# Patient Record
Sex: Female | Born: 1945 | Hispanic: No | Smoking: Never smoker
Health system: Southern US, Community
[De-identification: ages and names within clinical notes are randomized; demographics above are authoritative.]

## PROBLEM LIST (undated history)

## (undated) DIAGNOSIS — N393 Stress incontinence (female) (male): Secondary | ICD-10-CM

## (undated) DIAGNOSIS — S82899A Other fracture of unspecified lower leg, initial encounter for closed fracture: Secondary | ICD-10-CM

## (undated) DIAGNOSIS — I10 Essential (primary) hypertension: Secondary | ICD-10-CM

## (undated) DIAGNOSIS — I6529 Occlusion and stenosis of unspecified carotid artery: Secondary | ICD-10-CM

## (undated) DIAGNOSIS — G35 Multiple sclerosis: Secondary | ICD-10-CM

## (undated) DIAGNOSIS — K589 Irritable bowel syndrome without diarrhea: Secondary | ICD-10-CM

## (undated) DIAGNOSIS — I340 Nonrheumatic mitral (valve) insufficiency: Secondary | ICD-10-CM

## (undated) DIAGNOSIS — E785 Hyperlipidemia, unspecified: Secondary | ICD-10-CM

## (undated) DIAGNOSIS — S92909A Unspecified fracture of unspecified foot, initial encounter for closed fracture: Secondary | ICD-10-CM

## (undated) DIAGNOSIS — E538 Deficiency of other specified B group vitamins: Secondary | ICD-10-CM

## (undated) DIAGNOSIS — R911 Solitary pulmonary nodule: Secondary | ICD-10-CM

## (undated) DIAGNOSIS — R159 Full incontinence of feces: Secondary | ICD-10-CM

## (undated) DIAGNOSIS — I5189 Other ill-defined heart diseases: Secondary | ICD-10-CM

## (undated) DIAGNOSIS — S42409A Unspecified fracture of lower end of unspecified humerus, initial encounter for closed fracture: Secondary | ICD-10-CM

## (undated) DIAGNOSIS — J3089 Other allergic rhinitis: Secondary | ICD-10-CM

## (undated) DIAGNOSIS — I447 Left bundle-branch block, unspecified: Secondary | ICD-10-CM

## (undated) HISTORY — DX: Full incontinence of feces: R15.9

## (undated) HISTORY — DX: Essential (primary) hypertension: I10

## (undated) HISTORY — DX: Other allergic rhinitis: J30.89

## (undated) HISTORY — DX: Hyperlipidemia, unspecified: E78.5

## (undated) HISTORY — DX: Stress incontinence (female) (male): N39.3

## (undated) HISTORY — DX: Nonrheumatic mitral (valve) insufficiency: I34.0

## (undated) HISTORY — PX: VAGINAL HYSTERECTOMY: SUR661

## (undated) HISTORY — DX: Deficiency of other specified B group vitamins: E53.8

## (undated) HISTORY — DX: Unspecified fracture of lower end of unspecified humerus, initial encounter for closed fracture: S42.409A

## (undated) HISTORY — DX: Solitary pulmonary nodule: R91.1

## (undated) HISTORY — DX: Multiple sclerosis: G35

## (undated) HISTORY — DX: Irritable bowel syndrome, unspecified: K58.9

## (undated) HISTORY — DX: Other ill-defined heart diseases: I51.89

## (undated) HISTORY — PX: SALPINGOOPHORECTOMY: SHX82

## (undated) HISTORY — DX: Unspecified fracture of unspecified foot, initial encounter for closed fracture: S92.909A

## (undated) HISTORY — DX: Other fracture of unspecified lower leg, initial encounter for closed fracture: S82.899A

## (undated) HISTORY — DX: Occlusion and stenosis of unspecified carotid artery: I65.29

## (undated) HISTORY — DX: Irritable bowel syndrome without diarrhea: K58.9

## (undated) HISTORY — DX: Left bundle-branch block, unspecified: I44.7

---

## 1998-11-21 ENCOUNTER — Other Ambulatory Visit: Admission: RE | Admit: 1998-11-21 | Discharge: 1998-11-21 | Payer: Self-pay | Admitting: Gynecology

## 1999-02-11 ENCOUNTER — Other Ambulatory Visit: Admission: RE | Admit: 1999-02-11 | Discharge: 1999-02-11 | Payer: Self-pay | Admitting: Gastroenterology

## 1999-02-11 ENCOUNTER — Encounter (INDEPENDENT_AMBULATORY_CARE_PROVIDER_SITE_OTHER): Payer: Self-pay

## 1999-05-23 ENCOUNTER — Encounter: Admission: RE | Admit: 1999-05-23 | Discharge: 1999-05-23 | Payer: Self-pay | Admitting: Gynecology

## 1999-05-23 ENCOUNTER — Encounter: Payer: Self-pay | Admitting: Gynecology

## 1999-12-13 ENCOUNTER — Other Ambulatory Visit: Admission: RE | Admit: 1999-12-13 | Discharge: 1999-12-13 | Payer: Self-pay | Admitting: Gynecology

## 2000-02-05 ENCOUNTER — Other Ambulatory Visit: Admission: RE | Admit: 2000-02-05 | Discharge: 2000-02-05 | Payer: Self-pay | Admitting: Gynecology

## 2000-02-05 ENCOUNTER — Encounter (INDEPENDENT_AMBULATORY_CARE_PROVIDER_SITE_OTHER): Payer: Self-pay | Admitting: Specialist

## 2000-03-24 ENCOUNTER — Inpatient Hospital Stay (HOSPITAL_COMMUNITY): Admission: RE | Admit: 2000-03-24 | Discharge: 2000-03-26 | Payer: Self-pay | Admitting: Gynecology

## 2000-03-24 ENCOUNTER — Encounter (INDEPENDENT_AMBULATORY_CARE_PROVIDER_SITE_OTHER): Payer: Self-pay

## 2000-05-25 ENCOUNTER — Encounter: Payer: Self-pay | Admitting: Gynecology

## 2000-05-25 ENCOUNTER — Encounter: Admission: RE | Admit: 2000-05-25 | Discharge: 2000-05-25 | Payer: Self-pay | Admitting: Gynecology

## 2000-07-21 HISTORY — PX: CHOLECYSTECTOMY: SHX55

## 2000-07-22 ENCOUNTER — Inpatient Hospital Stay (HOSPITAL_COMMUNITY): Admission: EM | Admit: 2000-07-22 | Discharge: 2000-07-24 | Payer: Self-pay | Admitting: Emergency Medicine

## 2000-07-22 ENCOUNTER — Encounter: Payer: Self-pay | Admitting: Emergency Medicine

## 2000-07-23 ENCOUNTER — Encounter: Payer: Self-pay | Admitting: Emergency Medicine

## 2000-07-23 ENCOUNTER — Encounter: Payer: Self-pay | Admitting: Cardiology

## 2000-07-29 ENCOUNTER — Encounter: Payer: Self-pay | Admitting: Family Medicine

## 2000-07-29 ENCOUNTER — Encounter: Admission: RE | Admit: 2000-07-29 | Discharge: 2000-07-29 | Payer: Self-pay | Admitting: Family Medicine

## 2000-08-12 ENCOUNTER — Encounter: Payer: Self-pay | Admitting: General Surgery

## 2000-08-12 ENCOUNTER — Observation Stay (HOSPITAL_COMMUNITY): Admission: RE | Admit: 2000-08-12 | Discharge: 2000-08-13 | Payer: Self-pay | Admitting: General Surgery

## 2000-08-12 ENCOUNTER — Encounter (INDEPENDENT_AMBULATORY_CARE_PROVIDER_SITE_OTHER): Payer: Self-pay

## 2000-09-21 ENCOUNTER — Ambulatory Visit (HOSPITAL_COMMUNITY): Admission: RE | Admit: 2000-09-21 | Discharge: 2000-09-21 | Payer: Self-pay | Admitting: Family Medicine

## 2000-09-21 ENCOUNTER — Encounter: Payer: Self-pay | Admitting: Family Medicine

## 2001-03-16 ENCOUNTER — Other Ambulatory Visit: Admission: RE | Admit: 2001-03-16 | Discharge: 2001-03-16 | Payer: Self-pay | Admitting: Gynecology

## 2001-03-24 ENCOUNTER — Ambulatory Visit (HOSPITAL_COMMUNITY): Admission: RE | Admit: 2001-03-24 | Discharge: 2001-03-24 | Payer: Self-pay | Admitting: Family Medicine

## 2001-03-24 ENCOUNTER — Encounter: Payer: Self-pay | Admitting: Family Medicine

## 2002-01-26 ENCOUNTER — Encounter: Admission: RE | Admit: 2002-01-26 | Discharge: 2002-01-26 | Payer: Self-pay | Admitting: Gynecology

## 2002-01-26 ENCOUNTER — Encounter: Payer: Self-pay | Admitting: Gynecology

## 2002-01-27 ENCOUNTER — Encounter: Admission: RE | Admit: 2002-01-27 | Discharge: 2002-01-27 | Payer: Self-pay | Admitting: Internal Medicine

## 2002-01-27 ENCOUNTER — Encounter: Payer: Self-pay | Admitting: Internal Medicine

## 2002-05-23 ENCOUNTER — Other Ambulatory Visit: Admission: RE | Admit: 2002-05-23 | Discharge: 2002-05-23 | Payer: Self-pay | Admitting: Gynecology

## 2002-07-21 HISTORY — PX: CARDIAC CATHETERIZATION: SHX172

## 2003-01-31 ENCOUNTER — Ambulatory Visit (HOSPITAL_COMMUNITY): Admission: RE | Admit: 2003-01-31 | Discharge: 2003-01-31 | Payer: Self-pay | Admitting: Orthopedic Surgery

## 2003-04-01 ENCOUNTER — Ambulatory Visit (HOSPITAL_COMMUNITY): Admission: RE | Admit: 2003-04-01 | Discharge: 2003-04-01 | Payer: Self-pay | Admitting: Internal Medicine

## 2003-04-01 ENCOUNTER — Encounter: Payer: Self-pay | Admitting: Internal Medicine

## 2003-05-17 ENCOUNTER — Encounter: Admission: RE | Admit: 2003-05-17 | Discharge: 2003-05-17 | Payer: Self-pay | Admitting: Internal Medicine

## 2003-05-26 ENCOUNTER — Ambulatory Visit (HOSPITAL_COMMUNITY): Admission: RE | Admit: 2003-05-26 | Discharge: 2003-05-26 | Payer: Self-pay | Admitting: Cardiology

## 2003-06-01 ENCOUNTER — Ambulatory Visit (HOSPITAL_COMMUNITY): Admission: RE | Admit: 2003-06-01 | Discharge: 2003-06-01 | Payer: Self-pay | Admitting: Cardiology

## 2003-09-20 ENCOUNTER — Encounter: Admission: RE | Admit: 2003-09-20 | Discharge: 2003-09-20 | Payer: Self-pay | Admitting: Internal Medicine

## 2003-10-04 ENCOUNTER — Ambulatory Visit (HOSPITAL_COMMUNITY): Admission: RE | Admit: 2003-10-04 | Discharge: 2003-10-04 | Payer: Self-pay | Admitting: Internal Medicine

## 2004-06-06 ENCOUNTER — Encounter: Admission: RE | Admit: 2004-06-06 | Discharge: 2004-06-06 | Payer: Self-pay | Admitting: Internal Medicine

## 2004-07-16 ENCOUNTER — Ambulatory Visit: Payer: Self-pay | Admitting: Gastroenterology

## 2004-07-23 ENCOUNTER — Ambulatory Visit: Payer: Self-pay | Admitting: Gastroenterology

## 2005-10-24 ENCOUNTER — Encounter: Admission: RE | Admit: 2005-10-24 | Discharge: 2005-10-24 | Payer: Self-pay | Admitting: Internal Medicine

## 2006-06-22 ENCOUNTER — Other Ambulatory Visit: Admission: RE | Admit: 2006-06-22 | Discharge: 2006-06-22 | Payer: Self-pay | Admitting: Gynecology

## 2006-07-21 DIAGNOSIS — I6529 Occlusion and stenosis of unspecified carotid artery: Secondary | ICD-10-CM

## 2006-07-21 HISTORY — DX: Occlusion and stenosis of unspecified carotid artery: I65.29

## 2006-11-02 ENCOUNTER — Encounter: Admission: RE | Admit: 2006-11-02 | Discharge: 2006-11-02 | Payer: Self-pay | Admitting: Internal Medicine

## 2007-04-26 ENCOUNTER — Ambulatory Visit: Payer: Self-pay | Admitting: Surgery

## 2007-11-04 ENCOUNTER — Encounter: Admission: RE | Admit: 2007-11-04 | Discharge: 2007-11-04 | Payer: Self-pay | Admitting: Internal Medicine

## 2008-11-06 ENCOUNTER — Encounter: Admission: RE | Admit: 2008-11-06 | Discharge: 2008-11-06 | Payer: Self-pay | Admitting: Internal Medicine

## 2009-11-26 ENCOUNTER — Encounter: Admission: RE | Admit: 2009-11-26 | Discharge: 2009-11-26 | Payer: Self-pay | Admitting: Internal Medicine

## 2010-12-03 NOTE — Procedures (Signed)
CAROTID DUPLEX EXAM   INDICATION:  Carotid artery bruit.   HISTORY:  Diabetes:  No.  Cardiac:  No.  Hypertension:  Yes.  Smoking:  No.  Previous Surgery:  No.  CV History:  No.  Amaurosis Fugax No, Paresthesias No, Hemiparesis No                                       RIGHT             LEFT  Brachial systolic pressure:         110               110  Brachial Doppler waveforms:         Triphasic         Triphasic  Vertebral direction of flow:        Antegrade         Antegrade  DUPLEX VELOCITIES (cm/sec)  CCA peak systolic                   85                93  ECA peak systolic                   67                75  ICA peak systolic                   61                61  ICA end diastolic                   19                25  PLAQUE MORPHOLOGY:                  Mixed             Mixed  PLAQUE AMOUNT:                      Mild              Mild  PLAQUE LOCATION:                    CCA, ICA          CCA, ICA   IMPRESSION:  A 20-39% bilateral internal carotid artery stenosis (low  end of range).   A preliminary copy was faxed to Dr. Jone Baseman office.   ___________________________________________  Janetta Hora. Darrick Penna, MD   DP/MEDQ  D:  04/26/2007  T:  04/26/2007  Job:  [id number]

## 2010-12-06 NOTE — Op Note (Signed)
[facility]  Patient:    Kristina Donovan, Kristina Donovan                  MRN: [id number] Adm. Date:  [id number] Attending:  Katrina Stack CC:         Abran Cantor. Clovis Riley, M.D., [facility], [facility]                           Operative Report  PREOPERATIVE DIAGNOSES 1. Abnormal uterine bleeding with complex hyperplasia, persistent and    unresponsive. 2. Transmural fibroid with luminal intrusion. 3. Uterine descensus with grade 3 prolapse. 4. Rectocele and enterocele.  POSTOPERATIVE DIAGNOSES 1. Abnormal uterine bleeding with complex hyperplasia, persistent and    unresponsive. 2. Transmural fibroid with luminal intrusion. 3. Uterine descensus with grade 3 prolapse. 4. Rectocele and enterocele.  PROCEDURE:  Vaginal hysterectomy, posterior and enterocele repairs.  SURGEON:  Gretta Cool, M.D.  ASSISTANT:  Raynald Kemp, M.D.  ANESTHESIA:  General endotracheal.  DESCRIPTION OF PROCEDURE:  Under excellent general anesthesia, with the patient prepped and draped in lithotomy position, a weighted speculum was placed in the vagina and the cervix grasped with single-tooth tenaculum. Cervix was then infiltrated with Xylocaine with epinephrine 1:200,000.  The cervical mucosa was then circumcised and the mucosa pushed off the lower uterine segment.  The cul-de-sac was then entered and the uterosacral ligaments clamped, cut, sutured and tied with 0 Vicryl.  The peritoneum was then entered and the Deaver placed beneath the bladder.  The cardinal ligaments were then clamped, cut, sutured and tied with 0 Vicryl.  The uterine vessels were then also clamped, cut, sutured and tied with 0 Vicryl.  At this point, the uterus remained large enough that it could not be easily delivered through the cul-de-sac.  It was then morcellated and sectioned so as to allow visualization of the ovarian pedicles.  The ovarian ligaments were then clamped, cut, sutured  and tied.  The upper pedicles were then doubly ligated with 0 Vicryl.  At this point, careful examination of the ovaries revealed the left ovary with previous tubal sterilization quite accessible.  The ovary was then removed by clamping across the infundibulopelvic pedicle.  On the right, the ovary was considered inaccessible and adherent enough high in the pelvis that it was not reasonable to consider vaginal removal, because of risk of injury to the pedicle that could not be managed from below.  Decision was made to leave the right ovary in situ.  At this point, the cul-de-sac was closed with a running suture of #0 Monocryl from anterior peritoneum to lateral pedicles, with high ligation of the cul-de-sac peritoneum.  The pursestring suture was then tied tight.  The cardinal and uterosacral complex was then plicated to the apex of the vaginal cuff at each angle with interrupted sutures of #0 Ethibond.  The full-thickness of endopelvic fascia was then incorporated to close the vaginal cuff as a subcuticular closure with 2-0 Vicryl.  At this point, the bleeding was well-controlled.  Attention was then turned to the posterior repair and enterocele repair.  The mucosa was incised at the introitus and the mucosa dissected from the perirectal fascia to the apex of the vaginal cuff.  The mucosa was then dissected by blunt and sharp dissection from the perirectal fascia to the apex of the vaginal cuff. The enterocele was then completely closed by reattaching the perirectal fascia to the vaginal cuff with interrupted  sutures of #0 Vicryl.  The perirectal mucosa was then plicated from the apex of the cuff to the introitus, so as to plicate the levator ani fascia.  The perineal body was then rebuilt with interrupted sutures of #2-0 Vicryl.  The mucosa was then trimmed and upper layers of perirectal fascia and mucosa closed as a subcuticular closure of 2-0 Vicryl.  At this point, the procedure was  terminated without complication and patient returned to the recovery room in excellent condition.  Estimated blood loss:  300 cc.  Replacement:  None.  Complications:  None. DD:  03/24/00 TD:  03/25/00 Job: [id number]

## 2010-12-06 NOTE — Discharge Summary (Signed)
[facility]  Patient:    Kristina Donovan, Kristina Donovan                     MRN: [id number] Adm. Date:  07/22/00 Disc. Date: 07/24/00 Attending:  Lewayne Bunting, M.D. Dictator:   Rozell Searing, P.A. CC:         Desma Maxim, M.D.   Referring Physician Discharge [facility]  PROCEDURES:  Adenosine Cardiolite July 23, 2000.  REASON FOR ADMISSION:  Kristina Donovan is a 65 year old female without prior history of heart disease who was referred from her primary care physician for evaluation of abnormal EKG and associated chest discomfort.  She was admitted for rule out of MI and further diagnostic evaluation.  LABORATORY DATA:  Serial cardiac enzymes negative.  Lipid profile: Cholesterol 174, triglyceride 89, HDL 56, LDL 100.  Metabolic profile normal. WBC 14.3, hemoglobin 12.7, hematocrit 39.2, platelet 297, neutrophils 84%. INR 0.8.  D-dimer 0.70.  Admission chest x-ray:  NAD.  Spiral CT of chest revealed no pulmonary embolus; small RLL nodules.  HOSPITAL COURSE:  Serial cardiac enzymes were negative for myocardial infarction.  Patient was referred for noninvasive cardiac workup.  Adenosine Cardiolite performed July 23, 2000 revealed no evidence of ischemia with question of septal scar (possibly due to LBBB); EF 54%.  D-dimer was elevated (0.70) leading to spiral CT which, however, revealed no pulmonary embolus.  However, small (3-4 mm) nodules were seen in the RLL with recommendation for follow-up CT scan in 2-3 months.  MEDICATION ADJUSTMENTS:  Resume previous home medication regimen.  Medications at discharge: 1. Hormone patch as previously directed. 2. Allegra as needed.  FOLLOW-UP:  Patient is instructed to follow-up with Dr. Andee Lineman in approximately 2-4 weeks.  She is to call our office to schedule an appointment.  DISCHARGE DIAGNOSES: 1. Noncardiac chest pain.    a. Negative cardiac enzymes.    b. Nonischemic adenosine Cardiolite; ejection  fraction 54% (question septal       scar).    c. Negative spiral CT for pulmonary embolus. 2. Left bundle-branch block. 3. Small right lower lobe nodules - follow-up CT scan in two to three months. DD:  07/24/00 TD:  07/24/00 Job: [id number]

## 2010-12-06 NOTE — H&P (Signed)
[facility]  Patient:    Kristina Donovan, Kristina Donovan                  MRN: [id number] Adm. Date:  [id number] Attending:  Katrina Stack CC:         Abran Cantor. Clovis Riley, M.D.                         History and Physical  CHIEF COMPLAINT 1. Abnormal uterine bleeding. 2. Pelvic support problems.  HISTORY OF PRESENT ILLNESS:  [age]-year-old white married gravida 3, para 2, Ab1, with history of extremely heavy menstrual flow x 7 days, with passage of clots, requiring pads and tampons and still coming through her clothing in some cycles.  She also has significant pelvic support problems with uterine descensus, the cervix descending through the introitus.  She also has significant rectocele and enterocele and is symptomatic with standing.  She also has a problem with urgency-type incontinence; minor stress does not require pads.  She also reports some difficulty controlling stool, particularly liquid and gas.  She has a history of difficult deliveries elsewhere, with pelvic floor laceration with one of her deliveries.  She is now admitted for definitive repair by vaginal hysterectomy and posterior and enterocele repairs.  We have discussed her incontinence at length.  She has virtually all urge incontinence with ______ leakage.  We have discussed that that is primarily an overactive bladder problem rather than a surgical repair. She is now admitted for repairs as above.  PAST MEDICAL HISTORY:  Usual childhood disease without sequelae.  MEDICAL ILLNESSES:  None of consequence.  PREVIOUS HOSPITALIZATIONS:  Surgery for deviated septum in 1980, tubal ligation in 1975, colonoscopy in 1998, sigmoidoscopy in 1999.  FAMILY HISTORY:  Mother died of melanoma and breast cancer.  Father is living with history of stroke, diabetes and kidney cancer at age 53.  One brother, age 37, has had heart surgery.  Remote family history of colon carcinoma.  PRESENT MEDICATIONS:   Premphase for hormone replacement.  SOCIAL HISTORY:  Patient is a Merchandiser, retail for [employer].  Husband is employed by [employer].  Their children are grown.  REVIEW OF SYSTEMS:  HEENT:  Denies symptoms.  CARDIORESPIRATORY:  Denies asthma, cough, bronchitis, shortness of breath.  GI/GU:  Denies frequency, urgency, dysuria, change in bowel habits, food intolerance.  She does have a remote history of Crohns disease in 37 with no evidence of active disease in 1999.  PHYSICAL EXAMINATION  GENERAL:  Well-developed, well-nourished white female, 5 feet 10 inches, weight 188 pounds.  VITAL SIGNS:  Blood pressure 146/74.  HEENT:  PERLA.  Fundi benign.  Oropharynx clear.  NECK:  Supple without mass or thyroid enlargement.  CHEST:  Clear P-to-A.  HEART:  Regular rate and rhythm, without murmur or cardiac enlargement.  BREASTS:  Soft, without mass, nodes or nipple discharge.  ABDOMEN:  Soft, scaphoid, without mass or organomegaly.  PELVIC:  External genitalia:  Normal female.  Vagina clean, rugose.  She has descent of the cervix, particularly the anterior lip, through the introitus. There is old obstetric laceration with elongation of the anterior lip.  The vagina is well-supported anteriorly with very little rotational descent of the vesicle neck noted.  No significant paravaginal defects.  She does have significant posterior weakness, with enterocele and rectocele, grade 2.  The uterus is upper-limits-normal-size.  Adnexa clear.  RECTAL:  Rectovaginal confirms.  Note:  She has diminished sphincter tone.  EXTREMITIES:  Negative.  NEUROLOGIC:  Physiologic.  IMPRESSIONS 1. Abnormal uterine bleeding with complex hyperplasia without atypia at    endometrial sampling. 2. Pelvic support problems with uterine descensus, grade 3, grade 2 enterocele    and rectocele. 3. Mixed incontinence, predominantly urge. 4. History of Crohns disease, inactive.  PLAN:  Vaginal hysterectomy,  posterior and enterocele repairs.  I have discussed with the patient, risks and benefits of the procedure, alternatives, the possibility of requiring subsequent procedure for incontinence. DD:  03/25/00 TD:  03/25/00 Job: [id number]

## 2010-12-06 NOTE — Op Note (Signed)
[facility]  Patient:    Kristina Donovan, Kristina Donovan                  MRN: [id number] Proc. Date: 08/12/00 Adm. Date:  [id number] Disc. Date: [id number] Attending:  Ronaldo Miyamoto CC:         Desma Maxim, M.D.  Lewayne Bunting, M.D.   Operative Report  PREOPERATIVE DIAGNOSIS:  Chronic cholecystitis with cholelithiasis.  POSTOPERATIVE DIAGNOSIS:  Chronic cholecystitis with cholelithiasis.  OPERATION:  Laparoscopic cholecystectomy with intraoperative cholangiogram.  SURGEON:  Angelia Mould. Derrell Lolling, M.D.  FIRST ASSISTANT:  Gita Kudo, M.D. and Anselm Pancoast. Zachery Dakins, M.D.  OPERATIVE INDICATIONS:  This is a 65 year old white female who was recently hospitalized for substernal chest pain, epigastric pain, and nausea.  She was thought to have cardiac etiology of her pain.  She underwent extensive noninvasive cardiac workup which was negative.  She also had spiral CT to rule out pulmonary embolus which was negative.  Gallbladder ultrasound was performed as an outpatient on January 9, and this shows contracted gallbladder with multiple gallstones.  Common bile duct was normal.  Liver function tests were normal.  She has become asymptomatic.   She is brought to the operating room electively for cholecystectomy.  INTRAOPERATIVE FINDINGS:  The gallbladder was severely chronically inflamed. It was contracted and markedly thick walled.  There were extensive adhesion of the omentum and transverse colon and duodenum to the gallbladder.  These adhesions were chronic, and I was able to take them down uneventfully with good visualization.  The liver appeared healthy.  The stomach otherwise appeared healthy.  Small intestine and large intestine were otherwise grossly normal.  There was no peritoneal fluid or peritoneal abnormality otherwise. Operative cholangiogram showed no filling defects for sure, normal intrahepatic and extrahepatic bowel ducts, good flow of  contrast into the duodenum.  There was vague shadow at the bifurcation of the right and left hepatic ducts with incomplete filling.  This looked more like extrinsic compression from a swollen liver and did not look like a gallstone.  There was also good dilatation of the intrahepatic bowel ducts.  Dr. Zachery Dakins and I and Dr. Lynnette Caffey concurred about this, and we felt the best thing for the patient would be to simply follow her clinically and liver function tests from time to time.  OPERATIVE TECHNIQUE:  Following the induction of general endotracheal anesthesia, the patients abdomen was prepped and draped in sterile fashion. Marcaine 0.5% with epinephrine was used as local infiltration anesthetic.  A transverse incision was made at the lower end of he umbilicus through a previous laparoscopy scar.  The fascia was incised in the midline, and the abdominal cavity entered under direct vision.  A 10 mm Hasson trocar was inserted and secured with pursestring suture of 0 Vicryl.  Pneumoperitoneum was created.  Video camera was inserted with visualization and findings as described above.  A 10 mm trocar was placed in the subxiphoid region, and two 5 mm trocars placed in the right mid abdomen.  With careful dissection with blunt instruments as well as scissor dissection, we were able to take the omental adhesions off of the liver and gallbladder and expose the fundus of the gallbladder.  We then elevated the gallbladder and carefully dissected on the right and on the left with scissors to dissect the intra-abdominal viscera off of the gallbladder.  This was a nice dissection and atraumatic.  The gallbladder was quite thick walled, quite contracted, chronically inflamed, and  difficult to control, but we were able to keep it elevated and see the anatomy well.  We were able to dissect out the infundibulum and the cystic duct and the cystic artery after some period of time.  We isolated the  cystic artery as it went onto the gallbladder, controlled it with metal clips and divided it.  We isolated the cystic ducts.  Because of the chronic inflammation, I chose to do a cholangiogram.  I chose a Cooke catheter into the cystic duct and performed a cholangiogram using the C-arm.  This showed intrahepatic and extrahepatic bowel ducts, no filling defects, no dilatation, and prompt flow of contrast into the duodenum.  There appeared to be a flow artifact at the junction of the right and left hepatic ducts, and I felt that this was most likely extrinsic compression, did not think that this was characteristic of a stone.  The cholangiogram catheter was removed.  The cystic duct was controlled with metal clips and divided.  The gallbladder was then dissected from its bed with electrocautery and sharp dissection, placed in the specimen bag, and removed. The gallbladder bed of the liver was cauterized with electrocautery.  The operative field was copiously irrigated with more than 2000 cc of saline.  At the completion of the case, there was no bleeding or bile leak whatsoever. The area of dissection was inspected.  The trocars were removed under direct vision, and there was no bleeding from the trocar sites.  Pneumoperitoneum was released.   The fascia at the umbilicus was closed with 0 Vicryl suture.  The skin incisions were closed with subcuticular sutures of 4-0 Vicryl and Steri-Strips.  Clean bandages were placed, and the patient was taken to the recovery room in stable condition.  Estimated blood loss was about 30 to 40 cc.  Complications were none.  Sponge, needle, and instrument counts were correct. DD:  08/12/00 TD:  08/12/00 Job: [id number]

## 2010-12-06 NOTE — Discharge Summary (Signed)
[facility]  Patient:    Kristina Donovan, Kristina Donovan                     MRN: [id number] Adm. Date:  03/24/00 Disc. Date: 03/26/00 Attending:  Gretta Cool, M.D. Dictator:   1080 CC:         Lupe Carney, M.D.   Discharge Summary  HISTORY OF PRESENT ILLNESS:  Kristina Donovan is a 65 year old white, married female, gravida 3, para 2, AB 1, who reported extremely heavy menstrual flow for seven days with passing of large clots which require pads and tampons and still coming through the clothing.  She has significant pelvic support problems with uterine descensus, cervix descending through introitus.  She has a significant rectocele and enterocele and is symptomatic with standing.  She also has a problem with urgency incontinence, mild stress, and does not require pads.  She also reports difficulty controlling stool, especially if liquid and gas.  She reports difficult deliveries elsewhere with pelvic lacerations with one of her deliveries.  She is now admitted for definitive therapy by vaginal hysterectomy, posterior and enterocele repairs.  The risks and benefits have been discussed with the patient and she is now admitted.  ADMISSION EXAM:  CHEST:  Clear to auscultation and percussion.  HEART:  Rate and rhythm are regular without murmur, gallop, or cardiac enlargement.  ABDOMEN:  Soft and scaphoid without masses or organomegaly.  PELVIC:  External genitalia are within normal limits for female.  Vagina clean and ruga shows descent of the cervix, particularly the anterior lip through the introitus.  There is an old obstetric laceration with elongation of the anterior lip.  The vagina is well-supported anteriorly with little rotational descent of the vesical neck.  There is no significant paravaginal defect.  She does have significant posterior weakness with enterocele and rectocele grade 2.  The uterus is upper normal limit size, and adnexa bilaterally  clear. Rectovaginal exam confirms.  There is diminished sphincter tone.  IMPRESSION: 1. Abnormal uterine bleeding with complex hyperplasia without atypia at    endometrial sampling. 2. Pelvic support problems with uterine descensus grade 3, enterocele and    rectocele grade 2.  PLAN:  Vaginal hysterectomy, posterior and enterocele repairs under general anesthesia.  The risks and benefits have been discussed with the patient.  She accepts these procedures.  LABORATORY DATA:  Admission hemoglobin 10.8, hematocrit 31.2.  On the first postoperative day, the hemoglobin was 9.2, hematocrit 26.7, and a white count of 7.7.  The EKG showed normal sinus rhythm, left axis deviation, left bundle branch block, abnormal EKG.  HOSPITAL COURSE:  The patient underwent hysterectomy, posterior and enterocele repairs under general anesthesia.  The procedure was completed without any complications and the patient was returned to the recovery room in excellent condition.  Estimated blood loss was 300 cc, replacement none.  Her postoperative course was without complications and she was discharged on the second postoperative day in excellent condition.  PATHOLOGY:  The pathology report revealed. 1. Proliferative endometrium with focal degenerative changes.  No residual    hyperplasia identified. 2. Adenomyosis. 3. Benign 1 cm uterine leiomyomata.  FINAL DISCHARGE INSTRUCTIONS:  Included no heavy lifting or straining, no vaginal entrance, and increased ambulation as tolerated.  She is to call for any fever of over 101.5 or failure of daily improvement.  Prior to discharge, she was passing gas and plan for Fleets enema and suppository if no results.  MEDICATIONS: 1. Vioxx 25 mg q.12-24h.  2. Percocet 1 p.o. q.4h. p.r.n. discomfort. 3. Estrogen patch.  She is to return to the office in one week for follow-up.  CONDITION ON DISCHARGE:  Excellent.  FINAL DISCHARGE DIAGNOSES: 1. Abnormal uterine  bleeding with complex hyperplasia, persistent and    unresponsive. 2. Transmural fibroid with luminal intrusion. 3. Uterine descensus with grade 3 prolapse. 4. Rectocele and enterocele.  PROCEDURES PERFORMED:  Vaginal hysterectomy, posterior and enterocele repairs under general anesthesia. DD:  04/20/00 TD:  04/20/00 Job: [id number]

## 2010-12-13 ENCOUNTER — Other Ambulatory Visit: Payer: Self-pay | Admitting: Internal Medicine

## 2010-12-13 DIAGNOSIS — Z1231 Encounter for screening mammogram for malignant neoplasm of breast: Secondary | ICD-10-CM

## 2011-01-10 ENCOUNTER — Ambulatory Visit
Admission: RE | Admit: 2011-01-10 | Discharge: 2011-01-10 | Disposition: A | Discharge status: Home / Self Care | Payer: 59 | Source: Ambulatory Visit | Attending: Internal Medicine | Admitting: Internal Medicine

## 2011-01-10 DIAGNOSIS — Z1231 Encounter for screening mammogram for malignant neoplasm of breast: Secondary | ICD-10-CM

## 2011-12-23 ENCOUNTER — Other Ambulatory Visit: Payer: Self-pay | Admitting: Internal Medicine

## 2011-12-23 DIAGNOSIS — Z1231 Encounter for screening mammogram for malignant neoplasm of breast: Secondary | ICD-10-CM

## 2012-01-09 ENCOUNTER — Ambulatory Visit
Admission: RE | Admit: 2012-01-09 | Discharge: 2012-01-09 | Disposition: A | Discharge status: Home / Self Care | Payer: 59 | Source: Ambulatory Visit | Attending: Internal Medicine | Admitting: Internal Medicine

## 2012-01-09 DIAGNOSIS — Z1231 Encounter for screening mammogram for malignant neoplasm of breast: Secondary | ICD-10-CM

## 2012-09-07 DIAGNOSIS — R05 Cough: Secondary | ICD-10-CM | POA: Diagnosis not present

## 2012-09-07 DIAGNOSIS — R509 Fever, unspecified: Secondary | ICD-10-CM | POA: Diagnosis not present

## 2012-09-07 DIAGNOSIS — R059 Cough, unspecified: Secondary | ICD-10-CM | POA: Diagnosis not present

## 2012-11-29 DIAGNOSIS — N390 Urinary tract infection, site not specified: Secondary | ICD-10-CM | POA: Diagnosis not present

## 2012-11-29 DIAGNOSIS — N3941 Urge incontinence: Secondary | ICD-10-CM | POA: Diagnosis not present

## 2012-11-29 DIAGNOSIS — R350 Frequency of micturition: Secondary | ICD-10-CM | POA: Diagnosis not present

## 2012-12-14 DIAGNOSIS — M79609 Pain in unspecified limb: Secondary | ICD-10-CM | POA: Diagnosis not present

## 2013-01-06 DIAGNOSIS — Z808 Family history of malignant neoplasm of other organs or systems: Secondary | ICD-10-CM | POA: Diagnosis not present

## 2013-01-06 DIAGNOSIS — I447 Left bundle-branch block, unspecified: Secondary | ICD-10-CM | POA: Diagnosis not present

## 2013-01-06 DIAGNOSIS — D1801 Hemangioma of skin and subcutaneous tissue: Secondary | ICD-10-CM | POA: Diagnosis not present

## 2013-01-06 DIAGNOSIS — I519 Heart disease, unspecified: Secondary | ICD-10-CM | POA: Diagnosis not present

## 2013-01-06 DIAGNOSIS — D239 Other benign neoplasm of skin, unspecified: Secondary | ICD-10-CM | POA: Diagnosis not present

## 2013-01-06 DIAGNOSIS — I789 Disease of capillaries, unspecified: Secondary | ICD-10-CM | POA: Diagnosis not present

## 2013-01-06 DIAGNOSIS — I119 Hypertensive heart disease without heart failure: Secondary | ICD-10-CM | POA: Diagnosis not present

## 2013-01-06 DIAGNOSIS — L819 Disorder of pigmentation, unspecified: Secondary | ICD-10-CM | POA: Diagnosis not present

## 2013-01-06 DIAGNOSIS — L538 Other specified erythematous conditions: Secondary | ICD-10-CM | POA: Diagnosis not present

## 2013-01-14 DIAGNOSIS — K589 Irritable bowel syndrome without diarrhea: Secondary | ICD-10-CM | POA: Diagnosis not present

## 2013-01-14 DIAGNOSIS — E538 Deficiency of other specified B group vitamins: Secondary | ICD-10-CM | POA: Diagnosis not present

## 2013-01-14 DIAGNOSIS — I10 Essential (primary) hypertension: Secondary | ICD-10-CM | POA: Diagnosis not present

## 2013-03-01 ENCOUNTER — Other Ambulatory Visit: Payer: Self-pay

## 2013-03-01 DIAGNOSIS — Z1231 Encounter for screening mammogram for malignant neoplasm of breast: Secondary | ICD-10-CM

## 2013-03-17 ENCOUNTER — Ambulatory Visit
Admission: RE | Admit: 2013-03-17 | Discharge: 2013-03-17 | Disposition: A | Discharge status: Home / Self Care | Payer: Medicare Other | Source: Ambulatory Visit

## 2013-03-17 DIAGNOSIS — Z1231 Encounter for screening mammogram for malignant neoplasm of breast: Secondary | ICD-10-CM

## 2013-03-23 ENCOUNTER — Other Ambulatory Visit: Payer: Self-pay | Admitting: Internal Medicine

## 2013-03-23 DIAGNOSIS — N6320 Unspecified lump in the left breast, unspecified quadrant: Secondary | ICD-10-CM

## 2013-03-23 DIAGNOSIS — R928 Other abnormal and inconclusive findings on diagnostic imaging of breast: Secondary | ICD-10-CM

## 2013-04-08 ENCOUNTER — Ambulatory Visit
Admission: RE | Admit: 2013-04-08 | Discharge: 2013-04-08 | Disposition: A | Discharge status: Home / Self Care | Payer: Medicare Other | Source: Ambulatory Visit | Attending: Internal Medicine | Admitting: Internal Medicine

## 2013-04-08 DIAGNOSIS — R928 Other abnormal and inconclusive findings on diagnostic imaging of breast: Secondary | ICD-10-CM | POA: Diagnosis not present

## 2013-05-12 DIAGNOSIS — G35 Multiple sclerosis: Secondary | ICD-10-CM | POA: Diagnosis not present

## 2013-05-12 DIAGNOSIS — H1045 Other chronic allergic conjunctivitis: Secondary | ICD-10-CM | POA: Diagnosis not present

## 2013-05-12 DIAGNOSIS — H04129 Dry eye syndrome of unspecified lacrimal gland: Secondary | ICD-10-CM | POA: Diagnosis not present

## 2013-05-12 DIAGNOSIS — H2510 Age-related nuclear cataract, unspecified eye: Secondary | ICD-10-CM | POA: Diagnosis not present

## 2013-07-22 DIAGNOSIS — M899 Disorder of bone, unspecified: Secondary | ICD-10-CM | POA: Diagnosis not present

## 2013-07-22 DIAGNOSIS — J309 Allergic rhinitis, unspecified: Secondary | ICD-10-CM | POA: Diagnosis not present

## 2013-07-22 DIAGNOSIS — E785 Hyperlipidemia, unspecified: Secondary | ICD-10-CM | POA: Diagnosis not present

## 2013-07-22 DIAGNOSIS — Z0000 Encounter for general adult medical examination without abnormal findings: Secondary | ICD-10-CM | POA: Diagnosis not present

## 2013-07-22 DIAGNOSIS — E538 Deficiency of other specified B group vitamins: Secondary | ICD-10-CM | POA: Diagnosis not present

## 2013-07-22 DIAGNOSIS — Z1331 Encounter for screening for depression: Secondary | ICD-10-CM | POA: Diagnosis not present

## 2013-07-22 DIAGNOSIS — I10 Essential (primary) hypertension: Secondary | ICD-10-CM | POA: Diagnosis not present

## 2013-07-22 DIAGNOSIS — M949 Disorder of cartilage, unspecified: Secondary | ICD-10-CM | POA: Diagnosis not present

## 2013-08-10 DIAGNOSIS — M899 Disorder of bone, unspecified: Secondary | ICD-10-CM | POA: Diagnosis not present

## 2013-12-08 DIAGNOSIS — N319 Neuromuscular dysfunction of bladder, unspecified: Secondary | ICD-10-CM | POA: Diagnosis not present

## 2013-12-08 DIAGNOSIS — N390 Urinary tract infection, site not specified: Secondary | ICD-10-CM | POA: Diagnosis not present

## 2013-12-08 DIAGNOSIS — N3941 Urge incontinence: Secondary | ICD-10-CM | POA: Diagnosis not present

## 2014-01-03 ENCOUNTER — Encounter: Payer: Self-pay | Admitting: General Surgery

## 2014-01-03 DIAGNOSIS — I10 Essential (primary) hypertension: Secondary | ICD-10-CM | POA: Insufficient documentation

## 2014-01-03 DIAGNOSIS — E785 Hyperlipidemia, unspecified: Secondary | ICD-10-CM | POA: Insufficient documentation

## 2014-01-11 ENCOUNTER — Ambulatory Visit (INDEPENDENT_AMBULATORY_CARE_PROVIDER_SITE_OTHER): Payer: Medicare Other | Admitting: Cardiology

## 2014-01-11 ENCOUNTER — Encounter: Payer: Self-pay | Admitting: Cardiology

## 2014-01-11 VITALS — BP 102/60 | HR 66 | Ht 67.5 in | Wt 175.0 lb

## 2014-01-11 DIAGNOSIS — Z808 Family history of malignant neoplasm of other organs or systems: Secondary | ICD-10-CM | POA: Diagnosis not present

## 2014-01-11 DIAGNOSIS — I10 Essential (primary) hypertension: Secondary | ICD-10-CM

## 2014-01-11 DIAGNOSIS — I447 Left bundle-branch block, unspecified: Secondary | ICD-10-CM | POA: Diagnosis not present

## 2014-01-11 DIAGNOSIS — D239 Other benign neoplasm of skin, unspecified: Secondary | ICD-10-CM | POA: Diagnosis not present

## 2014-01-11 DIAGNOSIS — L570 Actinic keratosis: Secondary | ICD-10-CM | POA: Diagnosis not present

## 2014-01-11 DIAGNOSIS — I5032 Chronic diastolic (congestive) heart failure: Secondary | ICD-10-CM | POA: Diagnosis not present

## 2014-01-11 DIAGNOSIS — D1801 Hemangioma of skin and subcutaneous tissue: Secondary | ICD-10-CM | POA: Diagnosis not present

## 2014-01-11 DIAGNOSIS — L819 Disorder of pigmentation, unspecified: Secondary | ICD-10-CM | POA: Diagnosis not present

## 2014-01-11 HISTORY — DX: Left bundle-branch block, unspecified: I44.7

## 2014-01-11 NOTE — Progress Notes (Signed)
[address] [place], [place]  [address] Phone: [phone] Fax:  [phone]  Date:  01/11/2014   ID:  Abbey, Veith 07/27/1945, MRN [id number]  PCP:  Irven Shelling, MD  Cardiologist:  Fransico Him, MD     History of Present Illness: Kristina Donovan is a 68 y.o. female with a history of HTN, chronic LBBB, dyslipidemia and chronic diastolic CHF who presents today for followup. She is doing well.  She denies any chest pain, SOB, DOE, LE edema, dizziness, palpitations or syncope.   Wt Readings from Last 3 Encounters:  01/11/14 175 lb (79.379 kg)     Past Medical History  Diagnosis Date  . Hypertension   . Hyperlipidemia   . Lung nodule     right lower lung  . Carotid artery stenosis 2008    mild bilateral  . Mild mitral regurgitation   . B12 deficiency     w chronic gastritis  . LBBB (left bundle branch block)   . Diastolic dysfunction   . Perennial allergic rhinitis   . IBS (irritable bowel syndrome)   . Fecal incontinence     Hx of this  . Urinary, incontinence, stress female   . Multiple sclerosis, relapsing-remitting   . Fracture, ankle     right  . Elbow fracture   . Fracture, foot     left    Current Outpatient Prescriptions  Medication Sig Dispense Refill  . calcium carbonate (OS-CAL) 600 MG TABS tablet Take 600 mg by mouth 2 (two) times daily with a meal.      . Cholecalciferol (VITAMIN D) 1000 UNITS capsule Take 1,000 Units by mouth daily.      Marland Kitchen lisinopril-hydrochlorothiazide (PRINZIDE,ZESTORETIC) 10-12.5 MG per tablet Take 1 tablet by mouth daily.      Marland Kitchen loperamide (IMODIUM A-D) 2 MG tablet Take 2 mg by mouth daily.      . pravastatin (PRAVACHOL) 40 MG tablet Take 40 mg by mouth daily.      . tamsulosin (FLOMAX) 0.4 MG CAPS capsule Take 0.4 mg by mouth daily.      . vitamin B-12 (CYANOCOBALAMIN) 1000 MCG tablet Take 1,000 mcg by mouth every other day. Three times a week       No current facility-administered medications  for this visit.    Allergies:    Allergies  Allergen Reactions  . Shellfish Allergy Anaphylaxis  . Fluzone [Flu Virus Vaccine]     Increased MS  . Nitrofuran Derivatives Diarrhea  . Sulfa Antibiotics Hives    Social History:  The patient  reports that she has never smoked. She does not have any smokeless tobacco history on file. She reports that she does not drink alcohol or use illicit drugs.   Family History:  The patient's family history includes Breast cancer in her mother; CAD in her brother and father; Heart disease in her father; Hyperlipidemia in her father; Hypertension in her father.   ROS:  Please see the history of present illness.      All other systems reviewed and negative.   PHYSICAL EXAM: VS:  BP 102/60  Pulse 66  Ht 5' 7.5" (1.715 m)  Wt 175 lb (79.379 kg)  BMI 26.99 kg/m2 Well nourished, well developed, in no acute distress HEENT: normal Neck: no JVD Cardiac:  normal S1, S2; RRR; no murmur Lungs:  clear to auscultation bilaterally, no wheezing, rhonchi or rales Abd: soft, nontender, no hepatomegaly Ext: no edema Skin: warm and dry  Neuro:  CNs 2-12 intact, no focal abnormalities noted      ASSESSMENT AND PLAN:  1. HTN well controlled - continue Lisinopril-HCT 2. Chronic diastolic CHF - continue Lisinopril-HCT 3. Chronic LBBB  Followup with me in 1 year  Signed, Fransico Him, MD 01/11/2014 11:38 AM

## 2014-01-11 NOTE — Patient Instructions (Signed)
Your physician recommends that you continue on your current medications as directed. Please refer to the Current Medication list given to you today.  Your physician wants you to follow-up in: 1 year with Dr. Turner. You will receive a reminder letter in the mail two months in advance. If you don't receive a letter, please call our office to schedule the follow-up appointment.  

## 2014-01-19 DIAGNOSIS — I10 Essential (primary) hypertension: Secondary | ICD-10-CM | POA: Diagnosis not present

## 2014-01-19 DIAGNOSIS — Z0000 Encounter for general adult medical examination without abnormal findings: Secondary | ICD-10-CM | POA: Diagnosis not present

## 2014-01-19 DIAGNOSIS — G35 Multiple sclerosis: Secondary | ICD-10-CM | POA: Diagnosis not present

## 2014-03-08 DIAGNOSIS — H532 Diplopia: Secondary | ICD-10-CM | POA: Diagnosis not present

## 2014-03-13 ENCOUNTER — Ambulatory Visit (INDEPENDENT_AMBULATORY_CARE_PROVIDER_SITE_OTHER): Payer: Medicare Other | Admitting: Neurology

## 2014-03-13 ENCOUNTER — Encounter: Payer: Self-pay | Admitting: Neurology

## 2014-03-13 VITALS — BP 140/60 | HR 68 | Temp 97.9°F | Resp 18 | Ht 69.0 in | Wt 173.8 lb

## 2014-03-13 DIAGNOSIS — G35 Multiple sclerosis: Secondary | ICD-10-CM | POA: Diagnosis not present

## 2014-03-13 DIAGNOSIS — G35D Multiple sclerosis, unspecified: Secondary | ICD-10-CM

## 2014-03-13 LAB — COMPLETE METABOLIC PANEL WITH GFR
ALBUMIN: 4.4 g/dL (ref 3.5–5.2)
ALK PHOS: 43 U/L (ref 39–117)
ALT: 22 U/L (ref 0–35)
AST: 19 U/L (ref 0–37)
BUN: 22 mg/dL (ref 6–23)
CALCIUM: 9.6 mg/dL (ref 8.4–10.5)
CHLORIDE: 100 meq/L (ref 96–112)
CO2: 33 mEq/L — ABNORMAL HIGH (ref 19–32)
Creat: 0.98 mg/dL (ref 0.50–1.10)
GFR, EST NON AFRICAN AMERICAN: 60 mL/min
GFR, Est African American: 69 mL/min
Glucose, Bld: 98 mg/dL (ref 70–99)
POTASSIUM: 3.8 meq/L (ref 3.5–5.3)
SODIUM: 139 meq/L (ref 135–145)
TOTAL PROTEIN: 6.6 g/dL (ref 6.0–8.3)
Total Bilirubin: 0.5 mg/dL (ref 0.2–1.2)

## 2014-03-13 LAB — CBC WITH DIFFERENTIAL/PLATELET
Basophils Absolute: 0 10*3/uL (ref 0.0–0.1)
Basophils Relative: 0 % (ref 0–1)
Eosinophils Absolute: 0.1 10*3/uL (ref 0.0–0.7)
Eosinophils Relative: 1 % (ref 0–5)
HEMATOCRIT: 34.8 % — AB (ref 36.0–46.0)
HEMOGLOBIN: 11.5 g/dL — AB (ref 12.0–15.0)
LYMPHS ABS: 3 10*3/uL (ref 0.7–4.0)
LYMPHS PCT: 49 % — AB (ref 12–46)
MCH: 29 pg (ref 26.0–34.0)
MCHC: 33 g/dL (ref 30.0–36.0)
MCV: 87.9 fL (ref 78.0–100.0)
Monocytes Absolute: 0.4 10*3/uL (ref 0.1–1.0)
Monocytes Relative: 6 % (ref 3–12)
NEUTROS PCT: 44 % (ref 43–77)
Neutro Abs: 2.7 10*3/uL (ref 1.7–7.7)
Platelets: 246 10*3/uL (ref 150–400)
RBC: 3.96 MIL/uL (ref 3.87–5.11)
RDW: 13.6 % (ref 11.5–15.5)
WBC: 6.2 10*3/uL (ref 4.0–10.5)

## 2014-03-13 NOTE — Patient Instructions (Addendum)
1.  We will set you up for Copaxone 40mg  three times a week (monday, wed, Fri).  Side effects may include injection-site reaction, chest tightness, shortness of breath, sweating, but it should only occur up to 30 minutes. 2.  We will get MRI of the brain and cervical spine with and without contrast [facility]  03/23/14 8am -10am  3.  We will check CBC with diff, CMP and vitamin D level 4.  If the vision problems recur, call and we can set you up for IV steroids 5.  Follow up in 6 months.

## 2014-03-13 NOTE — Progress Notes (Signed)
NEUROLOGY CONSULTATION NOTE  Kristina Donovan MRN: [id number] DOB: [birth year]  Referring provider: Dr. Laurann Montana Primary care provider: Dr. Laurann Montana  Reason for consult:  Vertical diplopia with history of MS  HISTORY OF PRESENT ILLNESS: Kristina Donovan is a 68 year old right-handed woman with history of relapsing-remitting MS, hypertension, hyperlipidemia, mild carotid artery disease, LBBB, mild MR, IBS, history of fecal incontinence, urinary incontinence, LBBB, and diastolic dysfunction who presents for MS. she is accompanied by her daughter.  She first exhibited symptoms in 1984. At that time she developed left leg numbness. In 1985, she developed right leg numbness. She then exhibited lower extremity numbness in 1986 as well. No definitive diagnosis was ever made at that time. She was doing well until 1997. At that time she developed leg numbness again. She reportedly had imaging of the brain, likely MRI with contrast. No lumbar puncture was performed. It was determined that she likely had multiple sclerosis but she was not started on a disease modifying agent because her symptoms had completely resolved.  In 2005, she had another flareup of left lower extremity numbness, this time associated with weakness. This followed a flu shot. She was not treated with IV steroids. At that time, she was evaluated by Dr. Erling Cruz at [facility].  She did have an MRI of the brain with and without contrast performed 10/16/03 for right sided numbness and weakness.  It revealed multiple, many ovoid-shaped, foci of hyperintensity in the bilateral periventricular white matter, including the callosal septal interface.  No abnormal enhancement.  MRA of the head was unremarkable.  She was started on Betaseron. She experienced flulike symptoms, but otherwise she tolerated it well. Since she did not have any further flareups, except for some mild symptoms of numbness, Dr. Erling Cruz discontinued her from the  Betaseron in 2013, since she was clinically and radiographically stable. This was discontinued and mainly because of the cost of the medication to the patient.  Last Monday, she developed sudden onset of vertical diplopia. It resolved the next day until midday, when it returned. She saw her primary care provider who started her on prednisone taper. Unfortunately, she did not take the prednisone taper exactly as prescribed. The diplopia persisted up until yesterday. She denied any blurred vision or eye pain. She denied any lower extremity symptoms. Possible trigger for this flareup could be stress related to her husband having been diagnosed with pancreatic cancer, as he was just recently started on therapy.  Medications include B12 1000 mcg three times weekly, vitamin D 1000 units, Flomax 0.4mg , pravastatin and Prinzide.  PAST MEDICAL HISTORY: Past Medical History  Diagnosis Date  . Hypertension   . Hyperlipidemia   . Lung nodule     right lower lung  . Carotid artery stenosis 2008    mild bilateral  . Mild mitral regurgitation   . B12 deficiency     w chronic gastritis  . LBBB (left bundle branch block)   . Diastolic dysfunction   . Perennial allergic rhinitis   . IBS (irritable bowel syndrome)   . Fecal incontinence     Hx of this  . Urinary, incontinence, stress female   . Multiple sclerosis, relapsing-remitting   . Fracture, ankle     right  . Elbow fracture   . Fracture, foot     left  . LBBB (left bundle branch block) 01/11/2014    PAST SURGICAL HISTORY: Past Surgical History  Procedure Laterality Date  . Cardiac catheterization  2004  Normal coronary arteries, chronic Exertional dyspnea, cardiolite 2008 normal  . Cholecystectomy  07/2000  . Vaginal hysterectomy      and enterocele repair  . Salpingoophorectomy      unilateral    MEDICATIONS: Current Outpatient Prescriptions on File Prior to Visit  Medication Sig Dispense Refill  . calcium carbonate (OS-CAL) 600  MG TABS tablet Take 600 mg by mouth 2 (two) times daily with a meal.      . Cholecalciferol (VITAMIN D) 1000 UNITS capsule Take 1,000 Units by mouth daily.      Marland Kitchen lisinopril-hydrochlorothiazide (PRINZIDE,ZESTORETIC) 10-12.5 MG per tablet Take 1 tablet by mouth daily.      Marland Kitchen loperamide (IMODIUM A-D) 2 MG tablet Take 2 mg by mouth daily.      . pravastatin (PRAVACHOL) 40 MG tablet Take 40 mg by mouth daily.      . tamsulosin (FLOMAX) 0.4 MG CAPS capsule Take 0.4 mg by mouth daily.      . vitamin B-12 (CYANOCOBALAMIN) 1000 MCG tablet Take 1,000 mcg by mouth every other day. Three times a week       No current facility-administered medications on file prior to visit.    ALLERGIES: Allergies  Allergen Reactions  . Shellfish Allergy Anaphylaxis  . Fluzone [Flu Virus Vaccine]     Increased MS  . Nitrofuran Derivatives Diarrhea  . Sulfa Antibiotics Hives    FAMILY HISTORY: Family History  Problem Relation Age of Onset  . Breast cancer Mother   . Hypertension Father   . Heart disease Father   . Hyperlipidemia Father   . CAD Father   . CAD Brother     SOCIAL HISTORY: History   Social History  . Marital Status: Married    Spouse Name: N/A    Number of Children: N/A  . Years of Education: N/A   Occupational History  . Not on file.   Social History Main Topics  . Smoking status: Never Smoker   . Smokeless tobacco: Not on file  . Alcohol Use: No  . Drug Use: No  . Sexual Activity: No   Other Topics Concern  . Not on file   Social History Narrative  . No narrative on file    REVIEW OF SYSTEMS: Constitutional: No fevers, chills, or sweats, no generalized fatigue, change in appetite Eyes: No visual changes, double vision, eye pain Ear, nose and throat: No hearing loss, ear pain, nasal congestion, sore throat Cardiovascular: No chest pain, palpitations Respiratory:  No shortness of breath at rest or with exertion, wheezes GastrointestinaI: No nausea, vomiting, diarrhea,  abdominal pain, fecal incontinence Genitourinary:  No dysuria, urinary retention or frequency Musculoskeletal:  No neck pain, back pain Integumentary: No rash, pruritus, skin lesions Neurological: as above Psychiatric: No depression, insomnia, anxiety Endocrine: No palpitations, fatigue, diaphoresis, mood swings, change in appetite, change in weight, increased thirst Hematologic/Lymphatic:  No anemia, purpura, petechiae. Allergic/Immunologic: no itchy/runny eyes, nasal congestion, recent allergic reactions, rashes  PHYSICAL EXAM: Filed Vitals:   03/13/14 0931  BP: 140/60  Pulse: 68  Temp: 97.9 F (36.6 C)  Resp: 18   General: No acute distress Head:  Normocephalic/atraumatic Neck: supple, no paraspinal tenderness, full range of motion Back: No paraspinal tenderness Heart: regular rate and rhythm Lungs: Clear to auscultation bilaterally. Vascular: No carotid bruits. Neurological Exam: Mental status: alert and oriented to person, place, and time, recent and remote memory intact, fund of knowledge intact, attention and concentration intact, speech fluent and not dysarthric, language intact. Cranial  nerves: CN I: not tested CN II: pupils equal, round and reactive to light, visual fields intact, fundi unremarkable, without vessel changes, exudates, hemorrhages or papilledema. CN III, IV, VI:  Left ptosis.  Full range of motion, no nystagmus. CN V: facial sensation intact CN VII: upper and lower face symmetric CN VIII: hearing intact CN IX, X: gag intact, uvula midline CN XI: sternocleidomastoid and trapezius muscles intact CN XII: tongue midline Bulk & Tone: normal, no fasciculations. Motor: 5 out of 5 for Sensation: Pinprick and vibration intact Deep Tendon Reflexes: 2+ throughout, although slightly more brisk in the left patellar. Toes downgoing. Finger to nose testing: No tremor or dysmetria. Heel to shin: No dysmetria. Gait: Ambulates with a mild left limp, which is  reportedly residual. Able to walk on toes and heels. Able to walk in tandem, although cautiously. Romberg with minimal sway.  IMPRESSION: Relapsing remitting multiple sclerosis.  PLAN: 1. After discussing the various disease modifying agents, she has decided on Copaxone 40 mg 3 times a week. Side effects discussed. 2. We'll check a vitamin D level. She is currently taking 1000 units daily. If level is less than 50, will have her increase the dose. 3. We'll check a CBC with differential and CMP. 4. If the diplopia or recurs, she will call us and we can set her up for IV Solu-Medrol, 1 g for 3 days. 5. We'll check MRI of the brain and cervical spine with and without contrast. 6. Followup in 6 months or as needed.  Thank you for allowing me to take part in the care of this patient.  Metta Clines, DO  CC:  Lavone Orn, MD

## 2014-03-14 LAB — VITAMIN D 25 HYDROXY (VIT D DEFICIENCY, FRACTURES): Vit D, 25-Hydroxy: 50 ng/mL (ref 30–89)

## 2014-03-15 ENCOUNTER — Telehealth: Payer: Self-pay | Admitting: *Deleted

## 2014-03-15 NOTE — Telephone Encounter (Signed)
Message copied by Claudie Revering on Wed Mar 15, 2014 11:21 AM ------      Message from: JAFFE, ADAM R      Created: Tue Mar 14, 2014  6:40 AM       Vit D level is 50, which is good.  But I would like it above 50.  Recommend increasing the D3 to 2000 IU daily.      ----- Message -----         From: Lab in [facility] Interface         Sent: 03/14/2014   4:13 AM           To: Dudley Major, DO                   ------

## 2014-03-15 NOTE — Telephone Encounter (Signed)
Patient is aware of vit D level and will increase to 2000 IU

## 2014-03-23 ENCOUNTER — Telehealth: Payer: Self-pay | Admitting: Neurology

## 2014-03-23 ENCOUNTER — Ambulatory Visit (HOSPITAL_COMMUNITY)
Admission: RE | Admit: 2014-03-23 | Discharge: 2014-03-23 | Disposition: A | Discharge status: Home / Self Care | Payer: Medicare Other | Source: Ambulatory Visit | Attending: Neurology | Admitting: Neurology

## 2014-03-23 ENCOUNTER — Ambulatory Visit (HOSPITAL_COMMUNITY): Admission: RE | Admit: 2014-03-23 | Payer: Medicare Other | Source: Ambulatory Visit

## 2014-03-23 DIAGNOSIS — R269 Unspecified abnormalities of gait and mobility: Secondary | ICD-10-CM | POA: Diagnosis not present

## 2014-03-23 DIAGNOSIS — M47812 Spondylosis without myelopathy or radiculopathy, cervical region: Secondary | ICD-10-CM | POA: Diagnosis not present

## 2014-03-23 DIAGNOSIS — G35 Multiple sclerosis: Secondary | ICD-10-CM | POA: Diagnosis not present

## 2014-03-23 DIAGNOSIS — H532 Diplopia: Secondary | ICD-10-CM | POA: Diagnosis not present

## 2014-03-23 DIAGNOSIS — M5030 Other cervical disc degeneration, unspecified cervical region: Secondary | ICD-10-CM | POA: Insufficient documentation

## 2014-03-23 MED ORDER — GADOBENATE DIMEGLUMINE 529 MG/ML IV SOLN
16.0000 mL | Freq: Once | INTRAVENOUS | Status: AC | PRN
  Administered 2014-03-23: 16 mL via INTRAVENOUS

## 2014-03-23 NOTE — Telephone Encounter (Signed)
Message copied by Annamaria Helling on Thu Mar 23, 2014 12:59 PM ------      Message from: JAFFE, ADAM R      Created: Thu Mar 23, 2014 12:32 PM       MRI shows no new or active MS.  It appears stable.      ----- Message -----         From: Rad Results In Interface         Sent: 03/23/2014  10:08 AM           To: Dudley Major, DO                   ------

## 2014-03-23 NOTE — Telephone Encounter (Signed)
Patient made aware of stable MR. She is to receive her medication on Friday and the nurse is do be out Monday to show her how to administer injection. She will call with any questions.

## 2014-03-30 ENCOUNTER — Ambulatory Visit: Payer: Medicare Other | Admitting: Neurology

## 2014-04-14 ENCOUNTER — Ambulatory Visit
Admission: RE | Admit: 2014-04-14 | Discharge: 2014-04-14 | Disposition: A | Discharge status: Home / Self Care | Payer: Medicare Other | Source: Ambulatory Visit

## 2014-04-14 ENCOUNTER — Other Ambulatory Visit: Payer: Self-pay

## 2014-04-14 DIAGNOSIS — Z1231 Encounter for screening mammogram for malignant neoplasm of breast: Secondary | ICD-10-CM | POA: Diagnosis not present

## 2014-05-26 DIAGNOSIS — M79601 Pain in right arm: Secondary | ICD-10-CM | POA: Diagnosis not present

## 2014-06-03 DIAGNOSIS — S9031XA Contusion of right foot, initial encounter: Secondary | ICD-10-CM | POA: Diagnosis not present

## 2014-06-05 ENCOUNTER — Ambulatory Visit (INDEPENDENT_AMBULATORY_CARE_PROVIDER_SITE_OTHER): Payer: Medicare Other | Admitting: Neurology

## 2014-06-05 ENCOUNTER — Encounter: Payer: Self-pay | Admitting: Neurology

## 2014-06-05 VITALS — BP 130/78 | HR 68 | Temp 98.2°F | Resp 18 | Wt 174.3 lb

## 2014-06-05 DIAGNOSIS — W19XXXA Unspecified fall, initial encounter: Secondary | ICD-10-CM | POA: Diagnosis not present

## 2014-06-05 DIAGNOSIS — R55 Syncope and collapse: Secondary | ICD-10-CM | POA: Diagnosis not present

## 2014-06-05 DIAGNOSIS — G35 Multiple sclerosis: Secondary | ICD-10-CM

## 2014-06-05 DIAGNOSIS — M79601 Pain in right arm: Secondary | ICD-10-CM

## 2014-06-05 NOTE — Patient Instructions (Addendum)
1.  I'm not sure of the cause for possibly passing out.  It doesn't seem like seizure but we will check EEG anyway.  I recommend seeing your heart doctor for any further workup from his end. 2.  Since you hit your head, we will get CT of the head [facility] 06/07/14 9:45 3.  I will refer you to Dr. Hulan Saas of [facility] for the arm pain. 4  Follow up in February.

## 2014-06-05 NOTE — Progress Notes (Signed)
NEUROLOGY FOLLOW UP OFFICE NOTE  Kristina Donovan [id number]  HISTORY OF PRESENT ILLNESS: Kristina Donovan is a 68 year old right-handed woman with history of relapsing-remitting MS, hypertension, hyperlipidemia, mild carotid artery disease, LBBB, mild MR, IBS, history of fecal incontinence, urinary incontinence, LBBB, and diastolic dysfunction who follows up for relapsing-remitting MS.  UPDATE: She was started on Copaxone.    MRI of the brain with and without contrast from 03/23/14 was stable compared to prior study from 2005.  MRI of the cervical spine with and without contrast showed non-enhancing plaques at C2 and C3 levels.  On 05/24/14, she woke up and had pain from the right shoulder and scapula down to the elbow.  It was a deep diffuse aching pain.  It was not a shooting radicular pain.  Over the next day or two, it spread down to the mid-forearm.  There was no associated numbness or weakness.  She saw a doctor who gave her tizanidine and advised her to use heating packs, which did not help.  Aleve did not help.  She took one of her husband's hydromorphone.  She cannot recall having strained her shoulder or arm.  A few days before, she was lifting a large heavy box of bottled water, but she felt fine.  On 06/02/14, she walked out of a store and she stood at the curb looking to step off and walk to her car.  The next thing she remembers was hitting the left side of her forehead and then her face.  She doesn't actually remember falling.  She denied any preceding lightheadedness or palpitations.  It was only a moment.  There was no reported seizure-like activity.  She did not bite her tongue or have incontinence.  She was not postictal.  She declined nearby police officers' recommendation to go to the ED.  She did go to the urgent care the following day for foot pain and was told to see her neurologist for possibly passing out.  She denied being dehydrated.  She denies history of  seizure.  03/13/14 LABS WBC 6.2, HGB 11.5, HCT 34.8, PLT 246, Na 139, K 3.8, Cl 100, CO2 33, glucose 98, BUN 22, CDr 0.98, ALP 43, AST 19, ALT 22, vitamin D 25-hydroxy 50.  HISTORY: She first exhibited symptoms in 1984. At that time she developed left leg numbness. In 1985, she developed right leg numbness. She then exhibited lower extremity numbness in 1986 as well. No definitive diagnosis was ever made at that time. She was doing well until 1997. At that time she developed leg numbness again. She reportedly had imaging of the brain, likely MRI with contrast. No lumbar puncture was performed. It was determined that she likely had multiple sclerosis but she was not started on a disease modifying agent because her symptoms had completely resolved.  In 2005, she had another flareup of left lower extremity numbness, this time associated with weakness. This followed a flu shot. She was not treated with IV steroids.  She had an MRI of the brain with and without contrast performed 10/16/03 for right sided numbness and weakness.  It revealed multiple, many ovoid-shaped, foci of hyperintensity in the bilateral periventricular white matter, including the callosal septal interface.  No abnormal enhancement.  MRA of the head was unremarkable.  She was started on Betaseron. She experienced flulike symptoms, but otherwise she tolerated it well. Since she did not have any further flareups, except for some mild symptoms of numbness, Betaseron was discontinued in  2013, since she was clinically and radiographically stable.  In August 2015, she developed sudden onset of vertical diplopia. It resolved the next day until midday, when it returned. She saw her primary care provider who started her on prednisone taper. Unfortunately, she did not take the prednisone taper exactly as prescribed. The diplopia persisted up until yesterday. She denied any blurred vision or eye pain. She denied any lower extremity symptoms. Possible trigger  for this flareup could be stress related to her husband having been diagnosed with pancreatic cancer, as he was just recently started on therapy.  Medications include Copaxone, B12 1000 mcg three times weekly, vitamin D 1000 units, Flomax 0.4mg , pravastatin and Prinzide.  PAST MEDICAL HISTORY: Past Medical History  Diagnosis Date  . Hypertension   . Hyperlipidemia   . Lung nodule     right lower lung  . Carotid artery stenosis 2008    mild bilateral  . Mild mitral regurgitation   . B12 deficiency     w chronic gastritis  . LBBB (left bundle branch block)   . Diastolic dysfunction   . Perennial allergic rhinitis   . IBS (irritable bowel syndrome)   . Fecal incontinence     Hx of this  . Urinary, incontinence, stress female   . Multiple sclerosis, relapsing-remitting   . Fracture, ankle     right  . Elbow fracture   . Fracture, foot     left  . LBBB (left bundle branch block) 01/11/2014    MEDICATIONS: Current Outpatient Prescriptions on File Prior to Visit  Medication Sig Dispense Refill  . calcium carbonate (OS-CAL) 600 MG TABS tablet Take 600 mg by mouth 2 (two) times daily with a meal.    . Cholecalciferol (VITAMIN D) 1000 UNITS capsule Take 1,000 Units by mouth daily.    Marland Kitchen lisinopril-hydrochlorothiazide (PRINZIDE,ZESTORETIC) 10-12.5 MG per tablet Take 1 tablet by mouth daily.    Marland Kitchen loperamide (IMODIUM A-D) 2 MG tablet Take 2 mg by mouth daily.    . pravastatin (PRAVACHOL) 40 MG tablet Take 40 mg by mouth daily.    . tamsulosin (FLOMAX) 0.4 MG CAPS capsule Take 0.4 mg by mouth daily.    . vitamin B-12 (CYANOCOBALAMIN) 1000 MCG tablet Take 1,000 mcg by mouth every other day. Three times a week     No current facility-administered medications on file prior to visit.    ALLERGIES: Allergies  Allergen Reactions  . Shellfish Allergy Anaphylaxis  . Fluzone [Flu Virus Vaccine]     Increased MS  . Nitrofuran Derivatives Diarrhea  . Sulfa Antibiotics Hives    FAMILY  HISTORY: Family History  Problem Relation Age of Onset  . Breast cancer Mother   . Hypertension Father   . Heart disease Father   . Hyperlipidemia Father   . CAD Father   . CAD Brother     SOCIAL HISTORY: History   Social History  . Marital Status: Married    Spouse Name: N/A    Number of Children: N/A  . Years of Education: N/A   Occupational History  . Not on file.   Social History Main Topics  . Smoking status: Never Smoker   . Smokeless tobacco: Not on file  . Alcohol Use: No  . Drug Use: No  . Sexual Activity: No   Other Topics Concern  . Not on file   Social History Narrative  . No narrative on file    REVIEW OF SYSTEMS: Constitutional: No fevers, chills, or sweats,  no generalized fatigue, change in appetite Eyes: No visual changes, double vision, eye pain Ear, nose and throat: No hearing loss, ear pain, nasal congestion, sore throat Cardiovascular: No chest pain, palpitations Respiratory:  No shortness of breath at rest or with exertion, wheezes GastrointestinaI: No nausea, vomiting, diarrhea, abdominal pain, fecal incontinence Genitourinary:  No dysuria, urinary retention or frequency Musculoskeletal:  No neck pain, back pain Integumentary: No rash, pruritus, skin lesions Neurological: as above Psychiatric: No depression, insomnia, anxiety Endocrine: No palpitations, fatigue, diaphoresis, mood swings, change in appetite, change in weight, increased thirst Hematologic/Lymphatic:  No anemia, purpura, petechiae. Allergic/Immunologic: no itchy/runny eyes, nasal congestion, recent allergic reactions, rashes  PHYSICAL EXAM: Filed Vitals:   06/05/14 1405  BP: 130/78  Pulse: 68  Temp: 98.2 F (36.8 C)  Resp: 18   General: No acute distress Head:  Normocephalic/atraumatic Eyes:  Fundoscopic exam unremarkable without vessel changes, exudates, hemorrhages or papilledema. Neck: supple, no paraspinal tenderness, full range of motion Heart:  Regular rate  and rhythm Lungs:  Clear to auscultation bilaterally Back: No paraspinal tenderness Neurological Exam: alert and oriented to person, place, and time. Attention span and concentration intact, recent and remote memory intact, fund of knowledge intact.  Speech fluent and not dysarthric, language intact.  CN II-XII intact. Fundoscopic exam unremarkable without vessel changes, exudates, hemorrhages or papilledema.  Bulk and tone normal, 5-/5 in right deltoid and 4+/5 right triceps, which may be limited due to pain.  Otherwise, muscle strength 5/5 throughout.  Sensation to light touch intact.  Deep tendon reflexes 2+ throughout.  Finger to nose with bilateral intention tremor.  Gait with limp.  IMPRESSION: 1.  Syncope versus concussion.  History of event does not really sound like seizure. 2.  Right shoulder and upper arm pain.  It sounds more myofascial than radicular or shoulder joint related. 3.  Relapsing-remitting MS, stable  PLAN: 1.  CT of head given recent fall and head traum 2.  EEG to evaluate syncope. 3.  Referral to sport's medicine to evaluate shoulder and arm. 4.  Also recommend seeing cardiologist, given that the episode sounds like syncope and she has history of LBBB 5.  For MS, she is on Copaxone and takes Vit D 1000 units daily 6.  She has follow up in February  Metta Clines, DO  CC:  Lavone Orn, MD

## 2014-06-06 ENCOUNTER — Encounter (INDEPENDENT_AMBULATORY_CARE_PROVIDER_SITE_OTHER): Payer: Medicare Other

## 2014-06-06 ENCOUNTER — Telehealth: Payer: Self-pay | Admitting: Cardiology

## 2014-06-06 ENCOUNTER — Ambulatory Visit (INDEPENDENT_AMBULATORY_CARE_PROVIDER_SITE_OTHER): Payer: Medicare Other | Admitting: Neurology

## 2014-06-06 ENCOUNTER — Encounter: Payer: Self-pay | Admitting: Radiology

## 2014-06-06 ENCOUNTER — Ambulatory Visit (INDEPENDENT_AMBULATORY_CARE_PROVIDER_SITE_OTHER): Payer: Medicare Other | Admitting: Cardiology

## 2014-06-06 ENCOUNTER — Encounter: Payer: Self-pay | Admitting: Cardiology

## 2014-06-06 VITALS — HR 78 | Ht 69.0 in | Wt 173.6 lb

## 2014-06-06 DIAGNOSIS — K5090 Crohn's disease, unspecified, without complications: Secondary | ICD-10-CM | POA: Insufficient documentation

## 2014-06-06 DIAGNOSIS — R55 Syncope and collapse: Secondary | ICD-10-CM | POA: Insufficient documentation

## 2014-06-06 DIAGNOSIS — I447 Left bundle-branch block, unspecified: Secondary | ICD-10-CM | POA: Diagnosis not present

## 2014-06-06 DIAGNOSIS — G35 Multiple sclerosis: Secondary | ICD-10-CM | POA: Insufficient documentation

## 2014-06-06 DIAGNOSIS — I10 Essential (primary) hypertension: Secondary | ICD-10-CM | POA: Diagnosis not present

## 2014-06-06 DIAGNOSIS — W19XXXA Unspecified fall, initial encounter: Secondary | ICD-10-CM

## 2014-06-06 DIAGNOSIS — E785 Hyperlipidemia, unspecified: Secondary | ICD-10-CM | POA: Diagnosis not present

## 2014-06-06 DIAGNOSIS — Z8249 Family history of ischemic heart disease and other diseases of the circulatory system: Secondary | ICD-10-CM

## 2014-06-06 DIAGNOSIS — I739 Peripheral vascular disease, unspecified: Secondary | ICD-10-CM

## 2014-06-06 NOTE — Assessment & Plan Note (Signed)
Pt referred today from Neurology after a syncopal episode 11/13.

## 2014-06-06 NOTE — Assessment & Plan Note (Signed)
She has had this since the 80's, possibly activated recently secondary to stress (her husband has stage 4 pancreatic cancer).

## 2014-06-06 NOTE — Assessment & Plan Note (Signed)
B/P controlled, she is not orthostatic.

## 2014-06-06 NOTE — Assessment & Plan Note (Signed)
Chronic. 

## 2014-06-06 NOTE — Progress Notes (Signed)
06/06/2014 Kristina Donovan   08/30/1945  [id number]  Primary Physician Irven Shelling, MD Primary Cardiologist: Dr Radford Pax  HPI:  Pleasant 68 y/o female followed by Dr Radford Pax with a history of HTN, dyslipidemia, FM Hx of CAD, mild carotid PVD by remote doppler, Multiple Sclerosis, and chronic LBBB. She reportedly had a cath in 2002 that was OK- those records are not available to me. She has not had any cardiac issues since, she denies any exertional chest pain or dyspnea. She has been under a great deal of stress recently, her husband has recently been diagnosed with stage 4 pancreatic cancer. On Nov 13th she had been shopping. She was standing at the curb, looked Lt then Rt, and the next thing she knew her head was on the pavement.  Her glasses had come off, she received minor facial abrasion. She declined transport to the hospital but did see an Urgent Care MD the next day. She denies any pre syncopal symptoms- no palpitations, no nausea, no visual disturbances. She has not had syncope in the past and has not had orthostatic symptoms.    Current Outpatient Prescriptions  Medication Sig Dispense Refill  . calcium carbonate (OS-CAL) 600 MG TABS tablet Take 600 mg by mouth 2 (two) times daily with a meal.    . Cholecalciferol (VITAMIN D) 1000 UNITS capsule Take 1,000 Units by mouth daily.    Marland Kitchen Glatiramer Acetate (COPAXONE) 40 MG/ML SOSY Inject into the skin once a week. 3 shots per week for Pt's MS Condition.    Marland Kitchen lisinopril-hydrochlorothiazide (PRINZIDE,ZESTORETIC) 10-12.5 MG per tablet Take 1 tablet by mouth daily.    . pravastatin (PRAVACHOL) 40 MG tablet Take 40 mg by mouth daily.    . tamsulosin (FLOMAX) 0.4 MG CAPS capsule Take 0.4 mg by mouth daily.    . vitamin B-12 (CYANOCOBALAMIN) 1000 MCG tablet Take 1,000 mcg by mouth every other day. Three times a week    . loperamide (IMODIUM A-D) 2 MG tablet Take 2 mg by mouth daily.     No current facility-administered medications for  this visit.    Allergies  Allergen Reactions  . Shellfish Allergy Anaphylaxis  . Fluzone [Flu Virus Vaccine]     Increased MS  . Nitrofuran Derivatives Diarrhea  . Sulfa Antibiotics Hives    History   Social History  . Marital Status: Married    Spouse Name: N/A    Number of Children: N/A  . Years of Education: N/A   Occupational History  . Not on file.   Social History Main Topics  . Smoking status: Never Smoker   . Smokeless tobacco: Not on file  . Alcohol Use: No  . Drug Use: No  . Sexual Activity: No   Other Topics Concern  . Not on file   Social History Narrative     Review of Systems: General: negative for chills, fever, night sweats or weight changes.  Cardiovascular: negative for chest pain, dyspnea on exertion, edema, orthopnea, palpitations, paroxysmal nocturnal dyspnea or shortness of breath Dermatological: negative for rash Respiratory: negative for cough or wheezing Urologic: negative for hematuria Abdominal: negative for nausea, vomiting, diarrhea, bright red blood per rectum, melena, or hematemesis Neurologic: negative for visual changes, syncope, or dizziness All other systems reviewed and are otherwise negative except as noted above.    Blood pressure , pulse 78, height 5\' 9"  (1.753 m), weight 173 lb 9.6 oz (78.744 kg).  General appearance: alert, cooperative and no distress Lungs: clear to  auscultation bilaterally Heart: regular rate and rhythm and S4 present  Neuro- Awake and alert, grossly intact Skin- cool and dry  EKG NSR LBBB  ASSESSMENT AND PLAN:   Syncope and collapse Pt referred today from Neurology after a syncopal episode 11/13.  Essential hypertension, benign B/P controlled, she is not orthostatic.  LBBB (left bundle branch block) Chronic  Multiple sclerosis She has had this since the 80's, possibly activated recently secondary to stress (her husband has stage 4 pancreatic cancer).   Family history of coronary artery  disease in brother Brother had an MI in his 63's, CABG at 32  Hyperlipemia On statin   PLAN  I ordered an echo and suggested we have her wear a monitor for a week. I considered a Lexiscan but she gave me no history of angina. If her echo shows abnormal LVF I would proceed with a Myoview or cath depending on results. She will follow up with Dt Turner in a few weeks.   Kristina Donovan KPA-C 06/06/2014 11:47 AM

## 2014-06-06 NOTE — Telephone Encounter (Signed)
I left a message on the patient's voice mail that she has been moved the the nurse schedule- was scheduled for an EKG at 11:00am- and put on Kerin Ransom (PA) schedule to follow up on her syncope per neurology.

## 2014-06-06 NOTE — Telephone Encounter (Signed)
Follow Up  Pt returned call to heather.. Please call back//sr

## 2014-06-06 NOTE — Assessment & Plan Note (Signed)
Brother had an MI in his 32's, CABG at 69

## 2014-06-06 NOTE — Assessment & Plan Note (Signed)
On statin.

## 2014-06-06 NOTE — Patient Instructions (Signed)
Your physician has requested that you have an echocardiogram. Echocardiography is a painless test that uses sound waves to create images of your heart. It provides your doctor with information about the size and shape of your heart and how well your heart's chambers and valves are working. This procedure takes approximately one hour. There are no restrictions for this procedure.  YOU WILL NEED TO WEAR A HEART MONITOR FOR 1 WEEK PER LUKE KILROY, PA  FOLLOW UP WITH DR. Radford Pax 1 MONTH

## 2014-06-06 NOTE — Progress Notes (Signed)
Lifewatch 30 day monitor applied. EOS 07-06-14

## 2014-06-07 ENCOUNTER — Ambulatory Visit (HOSPITAL_COMMUNITY)
Admission: RE | Admit: 2014-06-07 | Discharge: 2014-06-07 | Disposition: A | Discharge status: Home / Self Care | Payer: Medicare Other | Source: Ambulatory Visit | Attending: Neurology | Admitting: Neurology

## 2014-06-07 ENCOUNTER — Telehealth: Payer: Self-pay | Admitting: *Deleted

## 2014-06-07 DIAGNOSIS — G35 Multiple sclerosis: Secondary | ICD-10-CM | POA: Insufficient documentation

## 2014-06-07 DIAGNOSIS — W19XXXA Unspecified fall, initial encounter: Secondary | ICD-10-CM

## 2014-06-07 DIAGNOSIS — R55 Syncope and collapse: Secondary | ICD-10-CM

## 2014-06-07 DIAGNOSIS — S0990XA Unspecified injury of head, initial encounter: Secondary | ICD-10-CM | POA: Diagnosis not present

## 2014-06-07 NOTE — Telephone Encounter (Signed)
Patient is  aware that CT and EEG are both normal

## 2014-06-07 NOTE — Procedures (Signed)
ELECTROENCEPHALOGRAM REPORT  Date of Study: 06/06/2014  Patient's Name: Kristina Donovan MRN: [id number] Date of Birth: 1946-06-02  Referring Provider: Metta Clines, DO  Indication: 68 year old woman with multiple sclerosis and LBBB who had episode of fall and possible syncope  Medications: Copaxone  Technical Summary: This is a multichannel digital EEG recording, using the international 10-20 placement system.  Spike detection software was employed.  Description: The EEG background is symmetric, with a well-developed posterior dominant rhythm of 9-10 Hz, which is reactive to eye opening and closing.  Diffuse beta activity is seen, with a bilateral frontal preponderance.  No focal or generalized abnormalities are seen.  No focal or generalized epileptiform discharges are seen.  Stage II sleep is seen with symmetric sleep architecture.  Hyperventilation and photic stimulation were performed, and produced no abnormalities.  ECG revealed normal cardiac rate and rhythm.  Impression: This is a normal routine EEG of the awake and asleep states, with activating procedures.  A normal study does not rule out the possibility of a seizure disorder in this patient.  Eliezer Khawaja R. Tomi Likens, DO

## 2014-06-13 ENCOUNTER — Ambulatory Visit (HOSPITAL_COMMUNITY): Payer: Medicare Other | Attending: Cardiology | Admitting: Radiology

## 2014-06-13 DIAGNOSIS — I10 Essential (primary) hypertension: Secondary | ICD-10-CM | POA: Diagnosis not present

## 2014-06-13 DIAGNOSIS — Z8249 Family history of ischemic heart disease and other diseases of the circulatory system: Secondary | ICD-10-CM | POA: Diagnosis not present

## 2014-06-13 DIAGNOSIS — E785 Hyperlipidemia, unspecified: Secondary | ICD-10-CM | POA: Diagnosis not present

## 2014-06-13 DIAGNOSIS — R55 Syncope and collapse: Secondary | ICD-10-CM | POA: Diagnosis present

## 2014-06-13 NOTE — Progress Notes (Signed)
Echocardiogram performed.  

## 2014-06-19 DIAGNOSIS — J029 Acute pharyngitis, unspecified: Secondary | ICD-10-CM | POA: Diagnosis not present

## 2014-06-19 DIAGNOSIS — R05 Cough: Secondary | ICD-10-CM | POA: Diagnosis not present

## 2014-06-23 ENCOUNTER — Ambulatory Visit (INDEPENDENT_AMBULATORY_CARE_PROVIDER_SITE_OTHER): Payer: Medicare Other | Admitting: Family Medicine

## 2014-06-23 ENCOUNTER — Encounter: Payer: Self-pay | Admitting: Family Medicine

## 2014-06-23 VITALS — BP 134/70 | HR 73 | Ht 68.0 in | Wt 174.0 lb

## 2014-06-23 DIAGNOSIS — M79601 Pain in right arm: Secondary | ICD-10-CM | POA: Diagnosis not present

## 2014-06-23 NOTE — Assessment & Plan Note (Signed)
Patient's right arm pain differential includes cervical radiculopathy as well as rotator cuff tendinopathy. Patient was doing significantly better at this time. Patient does have some mild impingement signs that could be contribute in. We discussed the possibility of doing an ultrasound of her shoulder which patient declined. I agree secondary to this not changing management today. Patient was given exercises for the neck as well as for this shoulder. We discussed over-the-counter medications a could be beneficial as well as an icing protocol. Patient try these interventions and come back and see me again in 3-4 weeks.

## 2014-06-23 NOTE — Patient Instructions (Signed)
Good to meet you Ice 20 minutes 2 times daily. Usually after activity and before bed. Exercises 3 times a week. Alternate the handouts.  Vitamin D 2000-4000IU daily.  Turmeric 500mg  twice daily.  See me again in 3 weeks if any pain Happy holidays!!

## 2014-06-23 NOTE — Progress Notes (Signed)
[facility] [address], [place] [address] Phone: [phone] Subjective:    I'm seeing this patient by the request  of:  Kristina Likens, MD  CC: Right arm pain  [id number] Kristina Donovan is a 68 y.o. female coming in with complaint of right arm pain. Patient does have a past medical history significant for multiple sclerosis, left bundle branch block who unfortunately started having pain in the right shoulder and scapula starting on 05/24/2014. Patient describes the pain as a diffuse aching sensation that did not allow her to have full range of motion. Patient states that seem to involve her forearm over the course of next week. Patient denies though that there was any weakness or numbness of the hand. Patient tried some home modalities after going to an urgent care facility and did not have any significant improvement. Patient was seen by neurology for recent syncopal episode and was referred here for further evaluation. Patient states over the course last 2 weeks though it has significantly improved. Patient states that she is near her baseline. Patient states she still has a dull aching sensation overall. Denies any numbness or weakness. Patient states she is able to sleep on this arm. Able to do most of her daily activities. Still has a shooting pain with certain motions. Not taking any medications for it specifically.  Patient did have an MRI of her cervical spine in September 2015. MRI showed degenerative changes of C5-6 and C6-7 with foraminal stenosis have noted on the right side.     Past medical history, social, surgical and family history all reviewed in electronic medical record.   Review of Systems: No headache, visual changes, nausea, vomiting, diarrhea, constipation, dizziness, abdominal pain, skin rash, fevers, chills, night sweats, weight loss, swollen lymph nodes, body aches, joint swelling, muscle aches, chest pain, shortness of breath,  mood changes.   Objective Blood pressure 134/70, pulse 73, height 5\' 8"  (1.727 m), weight 174 lb (78.926 kg), SpO2 98 %.  General: No apparent distress alert and oriented x3 mood and affect normal, dressed appropriately.  HEENT: Pupils equal, extraocular movements intact  Respiratory: Patient's speak in full sentences and does not appear short of breath  Cardiovascular: No lower extremity edema, non tender, no erythema  Skin: Warm dry intact with no signs of infection or rash on extremities or on axial skeleton.  Abdomen: Soft nontender  Neuro: Cranial nerves II through XII are intact, neurovascularly intact in all extremities with 2+ DTRs and 2+ pulses.  Lymph: No lymphadenopathy of posterior or anterior cervical chain or axillae bilaterally.  Gait normal with good balance and coordination.  MSK:  Non tender with full range of motion and good stability and symmetric strength and tone of  elbows, wrist, hip, knee and ankles bilaterally.  Shoulder: Right Inspection reveals no abnormalities, with mild atrophy of the shoulder girdle but seems to be symmetric Palpation is normal with no tenderness over AC joint or bicipital groove. Range of motion lacks the last 5 of extension, forward flexion, internal rotation only to L5.'s is moderately restricted compared to the contralateral side. Rotator cuff strength normal throughout. Positive impingement signs with Neer's and Hawkins Speeds and Yergason's tests normal. No labral pathology noted with negative Obrien's, negative clunk and good stability. Normal scapular function observed. No painful arc and no drop arm sign. No apprehension sign Contralateral shoulder unremarkable  Neck: Inspection unremarkable. No palpable stepoffs. Discomfort with positive Spurling's maneuver. Mild limitation in range of  motion especially with extension as well as right-sided side bending Grip strength and sensation normal in bilateral hands Strength good C4 to  T1 distribution No sensory change to C4 to T1 Negative Hoffman sign bilaterally Reflexes normal      Impression and Recommendations:     This case required medical decision making of moderate complexity.

## 2014-07-03 ENCOUNTER — Telehealth: Payer: Self-pay | Admitting: Cardiology

## 2014-07-03 NOTE — Telephone Encounter (Signed)
Pt called state that she has had a heart monitor on since 06/06/2014. She states that it began to beep and informed her to send a manual message// but the pt says she wasn't feeling anything. She believes it was "acting up" she called the 800# and they told her that she would need a new one and they would send it in 3 days. She says that she is due to send the monitor in by Friday so the pt doesn't think to order a new one. Pt asks if we have enough data. Please call back to discuss.

## 2014-07-03 NOTE — Telephone Encounter (Signed)
Left message for patient that I do not think there will be sufficient time to get a monitor to wear by Friday when she has to send it in. Instructed patient to call tomorrow if she has any further questions.

## 2014-07-07 ENCOUNTER — Ambulatory Visit: Payer: Medicare Other | Admitting: Cardiology

## 2014-07-07 ENCOUNTER — Telehealth: Payer: Self-pay | Admitting: Cardiology

## 2014-07-07 NOTE — Telephone Encounter (Signed)
Please let patient know that heart monitor was fine.  Average HR was 80bpm.

## 2014-07-10 ENCOUNTER — Encounter: Payer: Self-pay | Admitting: Cardiology

## 2014-07-10 NOTE — Telephone Encounter (Signed)
New message ° ° ° ° °Returning Katy's call °

## 2014-07-10 NOTE — Telephone Encounter (Signed)
Left message to call back  

## 2014-07-10 NOTE — Telephone Encounter (Signed)
Left message for the patient to call back after message received patient called the office.

## 2014-07-10 NOTE — Telephone Encounter (Signed)
This encounter was created in error - please disregard.

## 2014-07-11 NOTE — Telephone Encounter (Signed)
Patient informed of monitor results and verbal understanding expressed.  Per patient request, 12/29 appointment cancelled. Instructed patient to call the office if she has any questions.

## 2014-07-18 ENCOUNTER — Ambulatory Visit: Payer: Medicare Other | Admitting: Cardiology

## 2014-07-18 ENCOUNTER — Telehealth: Payer: Self-pay | Admitting: Neurology

## 2014-07-18 ENCOUNTER — Telehealth: Payer: Self-pay | Admitting: *Deleted

## 2014-07-18 NOTE — Telephone Encounter (Signed)
Pt called wanting to speak to a nurse regarding a letter she received in the mail regarding her meds. Please call pt# [phone]

## 2014-07-18 NOTE — Telephone Encounter (Signed)
Patient was advised to bring letter in at her next office visit on 2/16  for review

## 2014-07-25 DIAGNOSIS — I10 Essential (primary) hypertension: Secondary | ICD-10-CM | POA: Diagnosis not present

## 2014-07-25 DIAGNOSIS — R55 Syncope and collapse: Secondary | ICD-10-CM | POA: Diagnosis not present

## 2014-09-13 ENCOUNTER — Ambulatory Visit (INDEPENDENT_AMBULATORY_CARE_PROVIDER_SITE_OTHER): Payer: Medicare Other | Admitting: Neurology

## 2014-09-13 ENCOUNTER — Encounter: Payer: Self-pay | Admitting: Neurology

## 2014-09-13 VITALS — BP 130/70 | HR 78 | Resp 20 | Ht 69.0 in | Wt 178.5 lb

## 2014-09-13 DIAGNOSIS — G252 Other specified forms of tremor: Secondary | ICD-10-CM | POA: Diagnosis not present

## 2014-09-13 DIAGNOSIS — G35 Multiple sclerosis: Secondary | ICD-10-CM

## 2014-09-13 NOTE — Patient Instructions (Signed)
We will continue the Copaxone.  Follow up in 6 months

## 2014-09-13 NOTE — Progress Notes (Signed)
NEUROLOGY FOLLOW UP OFFICE NOTE  Kristina Donovan [id number]  HISTORY OF PRESENT ILLNESS: Kristina Donovan is a 69 year old right-handed woman with history of relapsing-remitting MS, hypertension, hyperlipidemia, mild carotid artery disease, LBBB, mild MR, IBS, history of fecal incontinence, urinary incontinence, LBBB, and diastolic dysfunction who follows up for relapsing-remitting MS.  CT of head, EEG, 2D echo report, and cardiology note reviewed.  UPDATE: In November, she had an episode where she lost consciousness, falling and hitting her face.  No seizure-like activity was witnessed.  She was not postictal.  EEG performed on 06/06/14 was normal.  CT of the head performed on 06/07/14 showed nothing acute.  She was evaluated by cardiology.  2D echo showed LVEF 55-60%.  She wore a heart monitor for a week, which was unremarkable.  She has not had any other episodes of syncope.  Etiology is unknown but it may have been dehydration.  She has been doing well.  She has been under a lot of stress however, as her husband has stage 4 pancreatic cancer.  HISTORY: She first exhibited symptoms in 1984. At that time she developed left leg numbness. In 1985, she developed right leg numbness. She then exhibited lower extremity numbness in 1986 as well. No definitive diagnosis was ever made at that time. She was doing well until 1997. At that time she developed leg numbness again. She reportedly had imaging of the brain, likely MRI with contrast. No lumbar puncture was performed. It was determined that she likely had multiple sclerosis but she was not started on a disease modifying agent because her symptoms had completely resolved.  In 2005, she had another flareup of left lower extremity numbness, this time associated with weakness. This followed a flu shot. She was not treated with IV steroids.  She had an MRI of the brain with and without contrast performed 10/16/03 for right sided numbness and weakness.  It  revealed multiple, many ovoid-shaped, foci of hyperintensity in the bilateral periventricular white matter, including the callosal septal interface.  No abnormal enhancement.  MRA of the head was unremarkable.  She was started on Betaseron. She experienced flulike symptoms, but otherwise she tolerated it well. Since she did not have any further flareups, except for some mild symptoms of numbness, Betaseron was discontinued in 2013, since she was clinically and radiographically stable.  In August 2015, she developed sudden onset of vertical diplopia. It resolved the next day until midday, when it returned. She saw her primary care provider who started her on prednisone taper. Unfortunately, she did not take the prednisone taper exactly as prescribed. The diplopia persisted up until yesterday. She denied any blurred vision or eye pain. She denied any lower extremity symptoms. Possible trigger for this flareup could be stress related to her husband having been diagnosed with pancreatic cancer, as he was just recently started on therapy.  Medications include Copaxone, B12 1000 mcg three times weekly, vitamin D 1000 units, Flomax 0.4mg , pravastatin and Prinzide.  MRI of the brain with and without contrast from 03/23/14 was stable compared to prior study from 2005.  MRI of the cervical spine with and without contrast showed non-enhancing plaques at C2 and C3 levels. PAST MEDICAL HISTORY: Past Medical History  Diagnosis Date  . Hypertension   . Hyperlipidemia   . Lung nodule     right lower lung  . Carotid artery stenosis 2008    mild bilateral  . Mild mitral regurgitation   . B12 deficiency  w chronic gastritis  . LBBB (left bundle branch block)   . Diastolic dysfunction   . Perennial allergic rhinitis   . IBS (irritable bowel syndrome)   . Fecal incontinence     Hx of this  . Urinary, incontinence, stress female   . Multiple sclerosis, relapsing-remitting   . Fracture, ankle     right  . Elbow  fracture   . Fracture, foot     left  . LBBB (left bundle branch block) 01/11/2014    MEDICATIONS: Current Outpatient Prescriptions on File Prior to Visit  Medication Sig Dispense Refill  . calcium carbonate (OS-CAL) 600 MG TABS tablet Take 600 mg by mouth 2 (two) times daily with a meal.    . Cholecalciferol (VITAMIN D) 1000 UNITS capsule Take 1,000 Units by mouth daily.    Marland Kitchen Glatiramer Acetate (COPAXONE) 40 MG/ML SOSY Inject into the skin once a week. 3 shots per week for Pt's MS Condition.    Marland Kitchen lisinopril-hydrochlorothiazide (PRINZIDE,ZESTORETIC) 10-12.5 MG per tablet Take 1 tablet by mouth daily.    Marland Kitchen loperamide (IMODIUM A-D) 2 MG tablet Take 2 mg by mouth daily.    . pravastatin (PRAVACHOL) 40 MG tablet Take 40 mg by mouth daily.    . tamsulosin (FLOMAX) 0.4 MG CAPS capsule Take 0.4 mg by mouth daily.    . vitamin B-12 (CYANOCOBALAMIN) 1000 MCG tablet Take 1,000 mcg by mouth every other day. Three times a week     No current facility-administered medications on file prior to visit.    ALLERGIES: Allergies  Allergen Reactions  . Shellfish Allergy Anaphylaxis  . Fluzone [Flu Virus Vaccine]     Increased MS  . Nitrofuran Derivatives Diarrhea  . Sulfa Antibiotics Hives    FAMILY HISTORY: Family History  Problem Relation Age of Onset  . Breast cancer Mother   . Hypertension Father   . Heart disease Father   . Hyperlipidemia Father   . CAD Father   . CAD Brother     SOCIAL HISTORY: History   Social History  . Marital Status: Married    Spouse Name: N/A  . Number of Children: N/A  . Years of Education: N/A   Occupational History  . Not on file.   Social History Main Topics  . Smoking status: Never Smoker   . Smokeless tobacco: Not on file  . Alcohol Use: No  . Drug Use: No  . Sexual Activity: No   Other Topics Concern  . Not on file   Social History Narrative    REVIEW OF SYSTEMS: Constitutional: No fevers, chills, or sweats, no generalized fatigue,  change in appetite Eyes: No visual changes, double vision, eye pain Ear, nose and throat: No hearing loss, ear pain, nasal congestion, sore throat Cardiovascular: No chest pain, palpitations Respiratory:  No shortness of breath at rest or with exertion, wheezes GastrointestinaI: No nausea, vomiting, diarrhea, abdominal pain, fecal incontinence Genitourinary:  No dysuria, urinary retention or frequency Musculoskeletal:  No neck pain, back pain Integumentary: No rash, pruritus, skin lesions Neurological: as above Psychiatric: Stress Endocrine: No palpitations, fatigue, diaphoresis, mood swings, change in appetite, change in weight, increased thirst Hematologic/Lymphatic:  No anemia, purpura, petechiae. Allergic/Immunologic: no itchy/runny eyes, nasal congestion, recent allergic reactions, rashes  PHYSICAL EXAM: Filed Vitals:   09/13/14 0927  BP: 130/70  Pulse: 78  Resp: 20   General: No acute distress Head:  Normocephalic/atraumatic Eyes:  Fundoscopic exam unremarkable without vessel changes, exudates, hemorrhages or papilledema. Neck: supple, no paraspinal  tenderness, full range of motion Heart:  Regular rate and rhythm Lungs:  Clear to auscultation bilaterally Back: No paraspinal tenderness Neurological Exam: alert and oriented to person, place, and time. Attention span and concentration intact, recent and remote memory intact, fund of knowledge intact.  Speech fluent and not dysarthric, language intact.  CN II-XII intact. Fundoscopic exam unremarkable without vessel changes, exudates, hemorrhages or papilledema.  Bulk and tone normal, 5-/5 in right deltoid and 4+/5 right triceps, which may be limited due to pain.  Otherwise, muscle strength 5/5 throughout.  Sensation to light touch intact.  Deep tendon reflexes 2+ throughout.  Finger to nose with bilateral intention tremor.  Gait with limp.  IMPRESSION: Relapsing-remitting MS, stable  PLAN: Copaxone D3 1000 units daily Follow up  in 6 months  Metta Clines, DO  CC: Lavone Orn, MD

## 2014-10-02 ENCOUNTER — Encounter: Payer: Self-pay | Admitting: Cardiology

## 2014-12-11 DIAGNOSIS — R350 Frequency of micturition: Secondary | ICD-10-CM | POA: Diagnosis not present

## 2014-12-11 DIAGNOSIS — N3941 Urge incontinence: Secondary | ICD-10-CM | POA: Diagnosis not present

## 2015-01-24 DIAGNOSIS — I10 Essential (primary) hypertension: Secondary | ICD-10-CM | POA: Diagnosis not present

## 2015-02-19 ENCOUNTER — Other Ambulatory Visit: Payer: Self-pay | Admitting: *Deleted

## 2015-02-19 MED ORDER — GLATIRAMER ACETATE 40 MG/ML ~~LOC~~ SOSY: 40.0000 mg | PREFILLED_SYRINGE | SUBCUTANEOUS | Status: DC

## 2015-03-14 ENCOUNTER — Ambulatory Visit: Payer: Medicare Other | Admitting: Neurology

## 2015-03-23 ENCOUNTER — Ambulatory Visit: Payer: Medicare Other | Admitting: Cardiology

## 2015-05-31 ENCOUNTER — Other Ambulatory Visit: Payer: Self-pay

## 2015-05-31 DIAGNOSIS — Z1231 Encounter for screening mammogram for malignant neoplasm of breast: Secondary | ICD-10-CM

## 2015-07-09 ENCOUNTER — Ambulatory Visit
Admission: RE | Admit: 2015-07-09 | Discharge: 2015-07-09 | Disposition: A | Discharge status: Home / Self Care | Payer: Medicare Other | Source: Ambulatory Visit

## 2015-07-09 DIAGNOSIS — Z1231 Encounter for screening mammogram for malignant neoplasm of breast: Secondary | ICD-10-CM

## 2015-07-10 ENCOUNTER — Telehealth: Payer: Self-pay

## 2015-07-10 DIAGNOSIS — H04123 Dry eye syndrome of bilateral lacrimal glands: Secondary | ICD-10-CM | POA: Diagnosis not present

## 2015-07-10 DIAGNOSIS — G35 Multiple sclerosis: Secondary | ICD-10-CM | POA: Diagnosis not present

## 2015-07-10 DIAGNOSIS — H2513 Age-related nuclear cataract, bilateral: Secondary | ICD-10-CM | POA: Diagnosis not present

## 2015-07-10 DIAGNOSIS — H5022 Vertical strabismus, left eye: Secondary | ICD-10-CM | POA: Diagnosis not present

## 2015-07-10 DIAGNOSIS — H10413 Chronic giant papillary conjunctivitis, bilateral: Secondary | ICD-10-CM | POA: Diagnosis not present

## 2015-07-10 DIAGNOSIS — H02831 Dermatochalasis of right upper eyelid: Secondary | ICD-10-CM | POA: Diagnosis not present

## 2015-07-10 DIAGNOSIS — H5703 Miosis: Secondary | ICD-10-CM | POA: Diagnosis not present

## 2015-07-10 DIAGNOSIS — H02834 Dermatochalasis of left upper eyelid: Secondary | ICD-10-CM | POA: Diagnosis not present

## 2015-07-10 NOTE — Telephone Encounter (Signed)
PT called and made a follow up with Dr Tomi Likens, his first available is in April, she said she needs a refill of her medication Copaxone/Dawn CB# [id number]

## 2015-07-10 NOTE — Telephone Encounter (Signed)
Patient requesting refill on Copaxone. Last seen in February. Was to return in 6 months. No follow up on schedule. Pt was given a one month supply as she is scheduled for injection tomorrow w/ no refills. Please advise. Should bloodwork be scheduled for this medication?

## 2015-07-10 NOTE — Telephone Encounter (Signed)
No blood work required.  Provide her refills.  Try to get her in February if possible (cancellation list).  Otherwise, I will see her in April.

## 2015-07-14 DIAGNOSIS — J069 Acute upper respiratory infection, unspecified: Secondary | ICD-10-CM | POA: Diagnosis not present

## 2015-07-27 ENCOUNTER — Other Ambulatory Visit: Payer: Self-pay | Admitting: Neurology

## 2015-07-27 NOTE — Telephone Encounter (Signed)
Last OV: 09/13/14 Next OV: 11/14/15

## 2015-07-31 DIAGNOSIS — Z0000 Encounter for general adult medical examination without abnormal findings: Secondary | ICD-10-CM | POA: Diagnosis not present

## 2015-07-31 DIAGNOSIS — M8588 Other specified disorders of bone density and structure, other site: Secondary | ICD-10-CM | POA: Diagnosis not present

## 2015-07-31 DIAGNOSIS — E7800 Pure hypercholesterolemia, unspecified: Secondary | ICD-10-CM | POA: Diagnosis not present

## 2015-07-31 DIAGNOSIS — I10 Essential (primary) hypertension: Secondary | ICD-10-CM | POA: Diagnosis not present

## 2015-07-31 DIAGNOSIS — Z1389 Encounter for screening for other disorder: Secondary | ICD-10-CM | POA: Diagnosis not present

## 2015-08-16 ENCOUNTER — Ambulatory Visit (INDEPENDENT_AMBULATORY_CARE_PROVIDER_SITE_OTHER): Payer: Medicare Other | Admitting: Neurology

## 2015-08-16 ENCOUNTER — Encounter: Payer: Self-pay | Admitting: Neurology

## 2015-08-16 ENCOUNTER — Other Ambulatory Visit (INDEPENDENT_AMBULATORY_CARE_PROVIDER_SITE_OTHER): Payer: Medicare Other

## 2015-08-16 VITALS — BP 126/66 | HR 83 | Ht 67.5 in | Wt 172.5 lb

## 2015-08-16 DIAGNOSIS — E559 Vitamin D deficiency, unspecified: Secondary | ICD-10-CM

## 2015-08-16 DIAGNOSIS — Z79899 Other long term (current) drug therapy: Secondary | ICD-10-CM | POA: Diagnosis not present

## 2015-08-16 DIAGNOSIS — G35 Multiple sclerosis: Secondary | ICD-10-CM

## 2015-08-16 LAB — VITAMIN D 25 HYDROXY (VIT D DEFICIENCY, FRACTURES): VITD: 46.01 ng/mL (ref 30.00–100.00)

## 2015-08-16 NOTE — Progress Notes (Signed)
NEUROLOGY FOLLOW UP OFFICE NOTE  [name] [id number]  HISTORY OF PRESENT ILLNESS: Kristina Donovan is a 70 year old right-handed woman with history of relapsing-remitting MS, hypertension, hyperlipidemia, mild carotid artery disease, LBBB, mild MR, IBS, history of fecal incontinence, urinary incontinence, LBBB, and diastolic dysfunction who follows up for relapsing-remitting MS.    UPDATE: She is taking Copaxone and D3 1000 IU daily.  Her husband passed away in 03-27-23, she she has been under a lot of stress due to her loss and getting his affairs in order.  She has not had any recurrent symptoms.  HISTORY: She first exhibited symptoms in 1984. At that time she developed left leg numbness. In 1985, she developed right leg numbness. She then exhibited lower extremity numbness in 1986 as well. No definitive diagnosis was ever made at that time. She was doing well until 1997. At that time she developed leg numbness again. She reportedly had imaging of the brain, likely MRI with contrast. No lumbar puncture was performed. It was determined that she likely had multiple sclerosis but she was not started on a disease modifying agent because her symptoms had completely resolved.  In 2005, she had another flareup of left lower extremity numbness, this time associated with weakness. This followed a flu shot. She was not treated with IV steroids.  She had an MRI of the brain with and without contrast performed 10/16/03 for right sided numbness and weakness.  It revealed multiple, many ovoid-shaped, foci of hyperintensity in the bilateral periventricular white matter, including the callosal septal interface.  No abnormal enhancement.  MRA of the head was unremarkable.  She was started on Betaseron. She experienced flulike symptoms, but otherwise she tolerated it well. Since she did not have any further flareups, except for some mild symptoms of numbness, Betaseron was discontinued in 2013, since she  was clinically and radiographically stable.  In August 2015, she developed sudden onset of vertical diplopia. She denied any blurred vision or eye pain. She denied any lower extremity symptoms.   In November 2015, she had an episode where she lost consciousness, falling and hitting her face.  No seizure-like activity was witnessed.  She was not postictal.  EEG performed on 06/06/14 was normal.  CT of the head performed on 06/07/14 showed nothing acute.  She was evaluated by cardiology.  2D echo showed LVEF 55-60%.  She wore a heart monitor for a week, which was unremarkable.  She has not had any other episodes of syncope.  Etiology is unknown but it may have been dehydration.    Medications include Copaxone, B12 1000 mcg three times weekly, vitamin D 1000 units, Flomax 0.4mg , pravastatin and Prinzide.  MRI of the brain with and without contrast from 03/23/14 was stable compared to prior study from 2005.  MRI of the cervical spine with and without contrast showed non-enhancing plaques at C2 and C3 levels.  PAST MEDICAL HISTORY: Past Medical History  Diagnosis Date  . Hypertension   . Hyperlipidemia   . Lung nodule     right lower lung  . Carotid artery stenosis 2008    mild bilateral  . Mild mitral regurgitation   . B12 deficiency     w chronic gastritis  . LBBB (left bundle branch block)   . Diastolic dysfunction   . Perennial allergic rhinitis   . IBS (irritable bowel syndrome)   . Fecal incontinence     Hx of this  . Urinary, incontinence, stress female   .  Multiple sclerosis, relapsing-remitting ([place])   . Fracture, ankle     right  . Elbow fracture   . Fracture, foot     left  . LBBB (left bundle branch block) 01/11/2014    MEDICATIONS: Current Outpatient Prescriptions on File Prior to Visit  Medication Sig Dispense Refill  . calcium carbonate (OS-CAL) 600 MG TABS tablet Take 600 mg by mouth 2 (two) times daily with a meal.    . Cholecalciferol (VITAMIN D) 1000 UNITS capsule  Take 1,000 Units by mouth daily.    Marland Kitchen COPAXONE 40 MG/ML SOSY INJECT 40 MG (1 SYRINGE) SUBCUTANEOUSLY THREE TIMES PER WEEK 12 Syringe 3  . lisinopril-hydrochlorothiazide (PRINZIDE,ZESTORETIC) 10-12.5 MG per tablet Take 1 tablet by mouth daily.    Marland Kitchen loperamide (IMODIUM A-D) 2 MG tablet Take 2 mg by mouth daily.    . pravastatin (PRAVACHOL) 40 MG tablet Take 40 mg by mouth daily.    . tamsulosin (FLOMAX) 0.4 MG CAPS capsule Take 0.4 mg by mouth daily.    . vitamin B-12 (CYANOCOBALAMIN) 1000 MCG tablet Take 1,000 mcg by mouth every other day. Three times a week     No current facility-administered medications on file prior to visit.    ALLERGIES: Allergies  Allergen Reactions  . Shellfish Allergy Anaphylaxis  . Fluzone [Flu Virus Vaccine]     Increased MS  . Nitrofuran Derivatives Diarrhea  . Sulfa Antibiotics Hives    FAMILY HISTORY: Family History  Problem Relation Age of Onset  . Breast cancer Mother   . Hypertension Father   . Heart disease Father   . Hyperlipidemia Father   . CAD Father   . CAD Brother     SOCIAL HISTORY: Social History   Social History  . Marital Status: Married    Spouse Name: N/A  . Number of Children: N/A  . Years of Education: N/A   Occupational History  . Not on file.   Social History Main Topics  . Smoking status: Never Smoker   . Smokeless tobacco: Not on file  . Alcohol Use: No  . Drug Use: No  . Sexual Activity: No   Other Topics Concern  . Not on file   Social History Narrative    REVIEW OF SYSTEMS: Constitutional: No fevers, chills, or sweats, no generalized fatigue, change in appetite Eyes: No visual changes, double vision, eye pain Ear, nose and throat: No hearing loss, ear pain, nasal congestion, sore throat Cardiovascular: No chest pain, palpitations Respiratory:  No shortness of breath at rest or with exertion, wheezes GastrointestinaI: No nausea, vomiting, diarrhea, abdominal pain, fecal incontinence Genitourinary:   No dysuria, urinary retention or frequency Musculoskeletal:  No neck pain, back pain Integumentary: No rash, pruritus, skin lesions Neurological: as above Psychiatric: depression Endocrine: No palpitations, fatigue, diaphoresis, mood swings, change in appetite, change in weight, increased thirst Hematologic/Lymphatic:  No anemia, purpura, petechiae. Allergic/Immunologic: no itchy/runny eyes, nasal congestion, recent allergic reactions, rashes  PHYSICAL EXAM: Filed Vitals:   08/16/15 0938  BP: 126/66  Pulse: 83   General: No acute distress.  Patient appears well-groomed.  normal body habitus. Head:  Normocephalic/atraumatic Eyes:  Fundoscopic exam unremarkable without vessel changes, exudates, hemorrhages or papilledema. Neck: supple, no paraspinal tenderness, full range of motion Heart:  Regular rate and rhythm Lungs:  Clear to auscultation bilaterally Back: No paraspinal tenderness Neurological Exam: alert and oriented to person, place, and time. Attention span and concentration intact, recent and remote memory intact, fund of knowledge intact.  Speech fluent  and not dysarthric, language intact.  CN II-XII intact. Fundoscopic exam unremarkable without vessel changes, exudates, hemorrhages or papilledema.  Bulk and tone normal, muscle strength 5/5 throughout.  Sensation to pinprick intact, decreased vibration sensation in toes.  Deep tendon reflexes 2+ throughout.  Finger to nose and heel to shin normal.  Gait normal station and stride.  Able to tandem walk.  Timed 25 foot walk 5.71 seconds.  Romberg negative  IMPRESSION: Relapsing-remitting MS, stable  PLAN: 1.  Continue Copaxone.  She asked about taking the shot less frequently.  I told her not with Copaxone, however there is Plegridy, which is a shot given every two weeks.  She would not like to make any changes at this time, since she is doing well clinically and is still dealing with stress. 2.  Continue D3 1000 IU daily.  Check  vitamin D level 3.  Follow up in one year  27 minutes spent face to face with patient, over 50% spent discussing management.   Metta Clines, DO  CC:  Lavone Orn, MD

## 2015-08-16 NOTE — Progress Notes (Signed)
Chart forwarded.  

## 2015-08-16 NOTE — Patient Instructions (Signed)
1.  Continue Copaxone 2.  We will check a vitamin D level 3  Follow up in one year

## 2015-08-17 ENCOUNTER — Telehealth: Payer: Self-pay

## 2015-08-17 NOTE — Telephone Encounter (Signed)
Injectable seasonal flu vaccine is okay (but not Flu-Mist or Fluzone).  Pneuovax is okay.  If she has had chicken pox, shingles vaccine is considered probably safe.

## 2015-08-17 NOTE — Telephone Encounter (Signed)
-----   Message from Pieter Partridge, DO sent at 08/17/2015  7:28 AM EST ----- D level is 46, which is good but I like the level to be above 50 for my MS patients.  Recommend adding a 400 unit pill to the 1000 unit pill.

## 2015-08-17 NOTE — Telephone Encounter (Signed)
Results relayed to patient.  Patient verbalized understanding and denied questions about lab results. Would like to know, however, if she should be  Shingles, pneumovax, and flu vaccines. States last flu vaccine was in '05, when she got very sick with it and has stayed away since. PCP wondering if she should or should not have immunizations. Please advise.

## 2015-08-17 NOTE — Telephone Encounter (Signed)
Message relayed to patient. Verbalized understanding and denied questions.   

## 2015-08-22 DIAGNOSIS — R350 Frequency of micturition: Secondary | ICD-10-CM | POA: Diagnosis not present

## 2015-08-22 DIAGNOSIS — N3941 Urge incontinence: Secondary | ICD-10-CM | POA: Diagnosis not present

## 2015-08-22 DIAGNOSIS — Z0000 Encounter for general adult medical examination without abnormal findings: Secondary | ICD-10-CM | POA: Diagnosis not present

## 2015-08-27 DIAGNOSIS — Z1211 Encounter for screening for malignant neoplasm of colon: Secondary | ICD-10-CM | POA: Diagnosis not present

## 2015-08-27 DIAGNOSIS — G35 Multiple sclerosis: Secondary | ICD-10-CM | POA: Diagnosis not present

## 2015-08-27 DIAGNOSIS — R159 Full incontinence of feces: Secondary | ICD-10-CM | POA: Diagnosis not present

## 2015-09-05 DIAGNOSIS — M810 Age-related osteoporosis without current pathological fracture: Secondary | ICD-10-CM | POA: Diagnosis not present

## 2015-10-04 DIAGNOSIS — Z1211 Encounter for screening for malignant neoplasm of colon: Secondary | ICD-10-CM | POA: Diagnosis not present

## 2015-11-14 ENCOUNTER — Ambulatory Visit: Payer: Medicare Other | Admitting: Neurology

## 2015-11-29 ENCOUNTER — Other Ambulatory Visit: Payer: Self-pay | Admitting: Neurology

## 2015-11-29 NOTE — Telephone Encounter (Signed)
Last OV: 08/16/15 Next OV: 08/17/16

## 2016-01-30 DIAGNOSIS — B351 Tinea unguium: Secondary | ICD-10-CM | POA: Diagnosis not present

## 2016-01-30 DIAGNOSIS — I10 Essential (primary) hypertension: Secondary | ICD-10-CM | POA: Diagnosis not present

## 2016-01-30 DIAGNOSIS — M810 Age-related osteoporosis without current pathological fracture: Secondary | ICD-10-CM | POA: Diagnosis not present

## 2016-02-05 ENCOUNTER — Encounter: Payer: Self-pay | Admitting: Podiatry

## 2016-02-05 ENCOUNTER — Ambulatory Visit (INDEPENDENT_AMBULATORY_CARE_PROVIDER_SITE_OTHER): Payer: Medicare Other | Admitting: Podiatry

## 2016-02-05 DIAGNOSIS — L603 Nail dystrophy: Secondary | ICD-10-CM

## 2016-02-05 DIAGNOSIS — B351 Tinea unguium: Secondary | ICD-10-CM | POA: Diagnosis not present

## 2016-02-05 NOTE — Progress Notes (Signed)
   Subjective:    Patient ID: Kristina Donovan, female    DOB: 1946-06-03, 70 y.o.   MRN: [id number]  HPI: She presents today with a 3 year history of thickening and discoloration of the toenails #1 #3 and #4 right foot. She states that they're painful with shoe gear they're beginning to turn color and starting to turn sideways. She denies trauma.    Review of Systems  Skin: Positive for color change.       Objective:   Physical Exam: Vital signs are stable alert and oriented 3. Pulses are palpable. Alert and oriented 3. Neurologic shows intact degenerative flexors are intact muscle strength is symmetrical bilateral. Orthopedic evaluation does resolve versus Vicryl for range of motion but crepitation. Cutaneous evaluation of a straight supple well-hydrated cutis scar overlying the medial aspect of the first metatarsophalangeal joint consistent with bunion surgery. She also has discoloration of the hallux nail plate third digital nail plate and fourth digital nail plate of the right foot. They are thick and discolored painful on palpation as well as debridement. No other open lesions or wounds are noted.        Assessment & Plan:  Nail dystrophy 3 toes of the right foot.  Plan: A small sample of nail and skin were taken today to be sent for pathologic evaluation we will notify her with the results as they come in. She states that she will have no problem taking oral therapy.

## 2016-02-13 ENCOUNTER — Other Ambulatory Visit (HOSPITAL_COMMUNITY): Payer: Self-pay | Admitting: *Deleted

## 2016-02-14 ENCOUNTER — Ambulatory Visit (HOSPITAL_COMMUNITY)
Admission: RE | Admit: 2016-02-14 | Discharge: 2016-02-14 | Disposition: A | Discharge status: Home / Self Care | Payer: Medicare Other | Source: Ambulatory Visit | Attending: Internal Medicine | Admitting: Internal Medicine

## 2016-02-14 DIAGNOSIS — M810 Age-related osteoporosis without current pathological fracture: Secondary | ICD-10-CM | POA: Insufficient documentation

## 2016-02-14 MED ORDER — SODIUM CHLORIDE 0.9 % IV SOLN
Freq: Once | INTRAVENOUS | Status: AC
  Administered 2016-02-14: 10:00:00 via INTRAVENOUS

## 2016-02-14 MED ORDER — ZOLEDRONIC ACID 5 MG/100ML IV SOLN
INTRAVENOUS | Status: AC
  Filled 2016-02-14: qty 100

## 2016-02-14 MED ORDER — ZOLEDRONIC ACID 5 MG/100ML IV SOLN
5.0000 mg | Freq: Once | INTRAVENOUS | Status: AC
  Administered 2016-02-14: 5 mg via INTRAVENOUS

## 2016-02-14 NOTE — Discharge Instructions (Signed)

## 2016-03-04 ENCOUNTER — Encounter: Payer: Self-pay | Admitting: Podiatry

## 2016-03-04 ENCOUNTER — Ambulatory Visit (INDEPENDENT_AMBULATORY_CARE_PROVIDER_SITE_OTHER): Payer: Medicare Other | Admitting: Podiatry

## 2016-03-04 DIAGNOSIS — L603 Nail dystrophy: Secondary | ICD-10-CM | POA: Diagnosis not present

## 2016-03-04 DIAGNOSIS — Z79899 Other long term (current) drug therapy: Secondary | ICD-10-CM

## 2016-03-04 NOTE — Patient Instructions (Signed)

## 2016-03-05 NOTE — Progress Notes (Signed)
She presents today for follow-up of her positive onychomycosis. She relates that after we took the samples and debrided the nails filed and smoothed last visit she has had no more problem with her nails and they have not been painful.  Objective: No change in physical exam pathology report does demonstrate positive onychomycosis.  Assessment: Onychomycosis.  Plan: After discussing the pros and cons of oral therapy and excluding topical therapy based on its tight indications we discussed the possibility of laser therapy. At this point she is requesting to follow up with me in a few weeks just for re-debridement and thinning of the nails.

## 2016-04-08 ENCOUNTER — Ambulatory Visit (INDEPENDENT_AMBULATORY_CARE_PROVIDER_SITE_OTHER): Payer: Medicare Other | Admitting: Podiatry

## 2016-04-08 ENCOUNTER — Encounter: Payer: Self-pay | Admitting: Podiatry

## 2016-04-08 DIAGNOSIS — L603 Nail dystrophy: Secondary | ICD-10-CM

## 2016-04-08 DIAGNOSIS — B351 Tinea unguium: Secondary | ICD-10-CM | POA: Diagnosis not present

## 2016-04-08 DIAGNOSIS — M79676 Pain in unspecified toe(s): Secondary | ICD-10-CM | POA: Diagnosis not present

## 2016-04-08 NOTE — Progress Notes (Signed)
She presents today for follow-up of a painful hallux nail right and thick toenails 1 through 5 of the right foot.  Objective: Toenails are thick yellow dystrophic with mycotic 1 through 5 right foot.  His onychomycosis right foot.  Plan: Debridement of toenails 1 through 5 right foot.

## 2016-05-06 ENCOUNTER — Ambulatory Visit (INDEPENDENT_AMBULATORY_CARE_PROVIDER_SITE_OTHER): Payer: Medicare Other | Admitting: Podiatry

## 2016-05-06 ENCOUNTER — Encounter: Payer: Self-pay | Admitting: Podiatry

## 2016-05-06 DIAGNOSIS — B351 Tinea unguium: Secondary | ICD-10-CM

## 2016-05-06 DIAGNOSIS — M79676 Pain in unspecified toe(s): Secondary | ICD-10-CM

## 2016-05-06 NOTE — Progress Notes (Signed)
She presents today for chief complaint of nail dystrophy toes 1 through 5 of the right foot only.  Objective: Pulses remain palpable toenails. Be growing out a little bit in the thicknesses doing much better.  Assessment: Nail dystrophy toes 1245 right foot.  Plan: Debridement of nails 1 through 5 of the right foot. Follow up with her in a few weeks.

## 2016-06-03 ENCOUNTER — Other Ambulatory Visit: Payer: Self-pay | Admitting: Internal Medicine

## 2016-06-03 DIAGNOSIS — Z1231 Encounter for screening mammogram for malignant neoplasm of breast: Secondary | ICD-10-CM

## 2016-06-10 ENCOUNTER — Ambulatory Visit (INDEPENDENT_AMBULATORY_CARE_PROVIDER_SITE_OTHER): Payer: Medicare Other | Admitting: Podiatry

## 2016-06-10 ENCOUNTER — Encounter: Payer: Self-pay | Admitting: Podiatry

## 2016-06-10 DIAGNOSIS — B351 Tinea unguium: Secondary | ICD-10-CM

## 2016-06-10 DIAGNOSIS — M79676 Pain in unspecified toe(s): Secondary | ICD-10-CM | POA: Diagnosis not present

## 2016-06-10 NOTE — Progress Notes (Signed)
She presents today to complaint of painful elongated nails which are thickened and discolored 1 through 5 of the right foot.  Objective: Nails appear to be growing much better there appeared to be much thinner and healthier. Otherwise the nails are thick dystrophic onychomycotic and painful.  Assessment: Patient and limb standard onychomycosis.  Plan: Debridement of one through 5 of the right foot. Follow up with her in a month or so

## 2016-06-20 ENCOUNTER — Ambulatory Visit: Payer: Medicare Other | Admitting: Cardiology

## 2016-06-23 ENCOUNTER — Ambulatory Visit: Payer: Medicare Other | Admitting: Cardiology

## 2016-06-27 ENCOUNTER — Other Ambulatory Visit: Payer: Self-pay | Admitting: Neurology

## 2016-07-09 ENCOUNTER — Ambulatory Visit: Payer: Medicare Other

## 2016-07-10 ENCOUNTER — Ambulatory Visit: Payer: Medicare Other | Admitting: Podiatry

## 2016-07-24 ENCOUNTER — Other Ambulatory Visit (INDEPENDENT_AMBULATORY_CARE_PROVIDER_SITE_OTHER): Payer: Medicare Other

## 2016-07-24 ENCOUNTER — Encounter: Payer: Self-pay | Admitting: Neurology

## 2016-07-24 ENCOUNTER — Ambulatory Visit (INDEPENDENT_AMBULATORY_CARE_PROVIDER_SITE_OTHER): Payer: Medicare Other | Admitting: Neurology

## 2016-07-24 VITALS — BP 124/72 | HR 71 | Ht 67.5 in | Wt 173.8 lb

## 2016-07-24 DIAGNOSIS — G35 Multiple sclerosis: Secondary | ICD-10-CM

## 2016-07-24 DIAGNOSIS — E559 Vitamin D deficiency, unspecified: Secondary | ICD-10-CM | POA: Diagnosis not present

## 2016-07-24 DIAGNOSIS — M25561 Pain in right knee: Secondary | ICD-10-CM

## 2016-07-24 DIAGNOSIS — H539 Unspecified visual disturbance: Secondary | ICD-10-CM

## 2016-07-24 LAB — VITAMIN D 25 HYDROXY (VIT D DEFICIENCY, FRACTURES): VITD: 39.84 ng/mL (ref 30.00–100.00)

## 2016-07-24 NOTE — Progress Notes (Signed)
NEUROLOGY FOLLOW UP OFFICE NOTE  SHONTEL BERANEK [id number]  HISTORY OF PRESENT ILLNESS: Kristina Donovan is a 71 year old right-handed woman with history of relapsing-remitting MS, hypertension, hyperlipidemia, mild carotid artery disease, LBBB, mild MR, IBS, history of fecal incontinence, urinary incontinence, LBBB, and diastolic dysfunction who follows up for relapsing-remitting MS.     UPDATE: She is taking Copaxone.  D level from 08/16/15 was 46.01.  She was advised to increase D3 to 1400 IU daily but has continued 1000 mcg daily.  In December, she began seeing a moving floater in her right eye.  It is fairly constant.  She also had some brief episodes of a flashing light in the temporal aspect of her right eye.  She also reports brief episodes of seeing a prism or zigzag lines in the vision of her right eye.  There is no associated eye pain.  There is no associated headache.  She had one severe headache in her life, but otherwise no history of migraines.  For over a year, she has had occasional limp of her right leg.  Sometimes her right knee feels like it will give out.  In the past, she had associated pain but not recently.  She denies back pain.  She was told by the nurse from [place] that she no longer qualifies for the grant to help cover Copaxone.  She was told it would cost $2000 a month.   HISTORY: She first exhibited symptoms in 1984. At that time she developed left leg numbness. In 1985, she developed right leg numbness. She then exhibited lower extremity numbness in 1986 as well. No definitive diagnosis was ever made at that time. She was doing well until 1997. At that time she developed leg numbness again. She reportedly had imaging of the brain, likely MRI with contrast. No lumbar puncture was performed. It was determined that she likely had multiple sclerosis but she was not started on a disease modifying agent because her symptoms had completely resolved.  In 2005, she had  another flareup of left lower extremity numbness, this time associated with weakness. This followed a flu shot. She was not treated with IV steroids.  She had an MRI of the brain with and without contrast performed 10/16/03 for right sided numbness and weakness.  It revealed multiple, many ovoid-shaped, foci of hyperintensity in the bilateral periventricular white matter, including the callosal septal interface.  No abnormal enhancement.  MRA of the head was unremarkable.  She was started on Betaseron. She experienced flulike symptoms, but otherwise she tolerated it well. Since she did not have any further flareups, except for some mild symptoms of numbness, Betaseron was discontinued in 2013, since she was clinically and radiographically stable.  In August 2015, she developed sudden onset of vertical diplopia. She denied any blurred vision or eye pain. She denied any lower extremity symptoms.    In November 2015, she had an episode where she lost consciousness, falling and hitting her face.  No seizure-like activity was witnessed.  She was not postictal.  EEG performed on 06/06/14 was normal.  CT of the head performed on 06/07/14 showed nothing acute.  She was evaluated by cardiology.  2D echo showed LVEF 55-60%.  She wore a heart monitor for a week, which was unremarkable.  She has not had any other episodes of syncope.  Etiology is unknown but it may have been dehydration.     Medications include Copaxone, B12 1000 mcg three times weekly, vitamin D  1000 units, Flomax 0.4mg , pravastatin and Prinzide.   MRI of the brain with and without contrast from 03/23/14 was stable compared to prior study from 2005.  MRI of the cervical spine with and without contrast showed non-enhancing plaques at C2 and C3 levels, as well as degenerative arthritic changes.  PAST MEDICAL HISTORY: Past Medical History:  Diagnosis Date  . B12 deficiency    w chronic gastritis  . Carotid artery stenosis 2008   mild bilateral  .  Diastolic dysfunction   . Elbow fracture   . Fecal incontinence    Hx of this  . Fracture, ankle    right  . Fracture, foot    left  . Hyperlipidemia   . Hypertension   . IBS (irritable bowel syndrome)   . LBBB (left bundle branch block)   . LBBB (left bundle branch block) 01/11/2014  . Lung nodule    right lower lung  . Mild mitral regurgitation   . Multiple sclerosis, relapsing-remitting ([place])   . Perennial allergic rhinitis   . Urinary, incontinence, stress female     MEDICATIONS: Current Outpatient Prescriptions on File Prior to Visit  Medication Sig Dispense Refill  . Ascorbic Acid (VITAMIN C) 100 MG tablet Take 100 mg by mouth daily.    . calcium carbonate (OS-CAL) 600 MG TABS tablet Take 600 mg by mouth 2 (two) times daily with a meal.    . Cholecalciferol (VITAMIN D) 1000 UNITS capsule Take 1,000 Units by mouth daily.    Marland Kitchen COPAXONE 40 MG/ML SOSY INJECT ONE SYRINGE (40 MG) SUBCUTANEOUSLY THREE TIMES PER WEEK AT LEAST 48 HOURS APART. ALLOW SYRINGE TO WARM TO ROOM TEMPERATURE FOR 20 MIN 12 Syringe 6  . lisinopril-hydrochlorothiazide (PRINZIDE,ZESTORETIC) 10-12.5 MG per tablet Take 1 tablet by mouth daily.    Marland Kitchen loperamide (IMODIUM A-D) 2 MG tablet Take 2 mg by mouth daily.    . pravastatin (PRAVACHOL) 40 MG tablet Take 40 mg by mouth daily.    . tamsulosin (FLOMAX) 0.4 MG CAPS capsule Take 0.4 mg by mouth daily.    . vitamin B-12 (CYANOCOBALAMIN) 1000 MCG tablet Take 1,000 mcg by mouth every other day. Three times a week     No current facility-administered medications on file prior to visit.     ALLERGIES: Allergies  Allergen Reactions  . Shellfish Allergy Anaphylaxis  . Fluzone [Flu Virus Vaccine]     Increased MS  . Nitrofuran Derivatives Diarrhea  . Sulfa Antibiotics Hives    FAMILY HISTORY: Family History  Problem Relation Age of Onset  . Breast cancer Mother   . Hypertension Father   . Heart disease Father   . Hyperlipidemia Father   . CAD Father   .  CAD Brother     SOCIAL HISTORY: Social History   Social History  . Marital status: Married    Spouse name: N/A  . Number of children: N/A  . Years of education: N/A   Occupational History  . Not on file.   Social History Main Topics  . Smoking status: Never Smoker  . Smokeless tobacco: Never Used  . Alcohol use No  . Drug use: No  . Sexual activity: No   Other Topics Concern  . Not on file   Social History Narrative  . No narrative on file    REVIEW OF SYSTEMS: Constitutional: No fevers, chills, or sweats, no generalized fatigue, change in appetite Eyes: No visual changes, double vision, eye pain Ear, nose and throat: No hearing loss,  ear pain, nasal congestion, sore throat Cardiovascular: No chest pain, palpitations Respiratory:  No shortness of breath at rest or with exertion, wheezes GastrointestinaI: No nausea, vomiting, diarrhea, abdominal pain, fecal incontinence Genitourinary:  No dysuria, urinary retention or frequency Musculoskeletal:  No neck pain, back pain Integumentary: No rash, pruritus, skin lesions Neurological: as above Psychiatric: No depression, insomnia, anxiety Endocrine: No palpitations, fatigue, diaphoresis, mood swings, change in appetite, change in weight, increased thirst Hematologic/Lymphatic:  No purpura, petechiae. Allergic/Immunologic: no itchy/runny eyes, nasal congestion, recent allergic reactions, rashes  PHYSICAL EXAM: Vitals:   07/24/16 1118  BP: 124/72  Pulse: 71   General: No acute distress.  Patient appears well-groomed.  normal body habitus. Head:  Normocephalic/atraumatic Eyes:  Fundi examined but not visualized Neck: supple, no paraspinal tenderness, full range of motion Heart:  Regular rate and rhythm Lungs:  Clear to auscultation bilaterally Back: No paraspinal tenderness Neurological Exam: alert and oriented to person, place, and time. Attention span and concentration intact, recent and remote memory intact, fund of  knowledge intact. Speech fluent and not dysarthric, language intact. CN II-XII intact. No APD.  Bulk and tone normal, muscle strength 5/5 throughout. Sensation to pinprick intact, decreased vibration sensation in toes. Deep tendon reflexes 2+ throughout. Finger to nose and heel to shin normal.  Gait with right limp.  Timed 25 foot walk 6.83 seconds (5.71 seconds last year).  Romberg negative  IMPRESSION: 1.  Relapsing-remitting multiple sclerosis 2.  Visual disturbance.  Not consistent with optic neuritis.  Possibly a primary ophthalmologic etiology. 3.  Right knee instability.  Prior history of associated pain.  Consider arthritis.  Strength seems intact on objective testing.  PLAN: 1.  Check MRI of brain and cervical spine with and without contrast 2.  Continue Copaxone.  We will contact [employer] to see if we can have her continue brand-name Copaxone at a reasonable price (since she had been doing well on it).  Otherwise, we can try switching to generic.  However, if MRIs show disease progression, I would want to switch to a different disease modifying drug anyway. 3.  Check vitamin D level. 4.  Check X-ray of right knee 5.  If MRI shows disease progression, I will have her follow up immediately to discuss alternative disease modifying drugs.  Otherwise, she will follow up in one year.  26 minutes spent face to face with patient, over 50% spent discussing testing, management based on MRI results and financial difficulty regarding Copaxone.Metta Clines, DO  CC:  Lavone Orn, MD

## 2016-07-24 NOTE — Patient Instructions (Addendum)
1.  We will look into the financial situation with Copaxone 2.  We will check another vitamin D level 3.  We will repeat MRI of brain and cervical spine with and without contrast 4.  Please make an appointment with Dr. Katy Fitch and have his notes sent to me. 5.  If the MRIs show no changes, then we will continue Copaxone (we can try the generic if needed).  If the MRIs show progression of disease, then I want you to come back to see me to discuss other medications.  Otherwise, I will have you follow up in one year.

## 2016-07-25 ENCOUNTER — Telehealth: Payer: Self-pay

## 2016-07-25 DIAGNOSIS — H2513 Age-related nuclear cataract, bilateral: Secondary | ICD-10-CM | POA: Diagnosis not present

## 2016-07-25 DIAGNOSIS — H43392 Other vitreous opacities, left eye: Secondary | ICD-10-CM | POA: Diagnosis not present

## 2016-07-25 DIAGNOSIS — H10413 Chronic giant papillary conjunctivitis, bilateral: Secondary | ICD-10-CM | POA: Diagnosis not present

## 2016-07-25 DIAGNOSIS — H04123 Dry eye syndrome of bilateral lacrimal glands: Secondary | ICD-10-CM | POA: Diagnosis not present

## 2016-07-25 DIAGNOSIS — H5703 Miosis: Secondary | ICD-10-CM | POA: Diagnosis not present

## 2016-07-25 DIAGNOSIS — H43811 Vitreous degeneration, right eye: Secondary | ICD-10-CM | POA: Diagnosis not present

## 2016-07-25 NOTE — Telephone Encounter (Addendum)
Spoke to patient. Gave lab results and med instructions. Pt states she reported the wrong dose of Vit D3 yesterday. She is currently taking 2000 units daily. Advised pt to increase to 3000 units daily per Dr. Tomi Likens. Patient verbalized understanding.

## 2016-07-25 NOTE — Telephone Encounter (Signed)
-----   Message from Pieter Partridge, DO sent at 07/24/2016  5:26 PM EST ----- Vitamin D level is around 39.  I would like her to increase dose of D3 to 2000 Units daily

## 2016-07-29 ENCOUNTER — Telehealth: Payer: Self-pay | Admitting: Neurology

## 2016-07-29 NOTE — Telephone Encounter (Signed)
Spoke to patient. Advised spoke to Copaxone rep who recommends  patient contact [employer] in reference to financial situation. In the meantime will provide samples. Patient said a Therapist, sports from the grant department contacted her and they are working on grant money. She has 4 injections left.  She would like to know if she could have samples at least until end of the month. Advised samples available for pick up. Patient was grateful! Advised would inform Dr. Tomi Likens.

## 2016-07-29 NOTE — Telephone Encounter (Signed)
Please call pt about her Copaxone.  She will be out this week.

## 2016-07-31 ENCOUNTER — Ambulatory Visit (INDEPENDENT_AMBULATORY_CARE_PROVIDER_SITE_OTHER): Payer: Medicare Other | Admitting: Podiatry

## 2016-07-31 ENCOUNTER — Ambulatory Visit
Admission: RE | Admit: 2016-07-31 | Discharge: 2016-07-31 | Disposition: A | Discharge status: Home / Self Care | Payer: Medicare Other | Source: Ambulatory Visit | Attending: Neurology | Admitting: Neurology

## 2016-07-31 DIAGNOSIS — M79676 Pain in unspecified toe(s): Secondary | ICD-10-CM

## 2016-07-31 DIAGNOSIS — G35 Multiple sclerosis: Secondary | ICD-10-CM

## 2016-07-31 DIAGNOSIS — B351 Tinea unguium: Secondary | ICD-10-CM | POA: Diagnosis not present

## 2016-07-31 DIAGNOSIS — M50222 Other cervical disc displacement at C5-C6 level: Secondary | ICD-10-CM | POA: Diagnosis not present

## 2016-07-31 MED ORDER — GADOBENATE DIMEGLUMINE 529 MG/ML IV SOLN
15.0000 mL | Freq: Once | INTRAVENOUS | Status: AC | PRN
  Administered 2016-07-31: 15 mL via INTRAVENOUS

## 2016-07-31 NOTE — Progress Notes (Signed)
She presents today chief complaint of painful elongated toenails.  Objective: Vital signs are stable alert and oriented 3. Pulses are palpable. Her toenails are thick and yellow dystrophic onychomycotic 1 through 5 of the right foot  Assessment: Onychomycosis with pain in limb.  Plan: Debridement of toenails 1 through 5 right foot.

## 2016-08-04 ENCOUNTER — Telehealth: Payer: Self-pay

## 2016-08-04 ENCOUNTER — Telehealth: Payer: Self-pay | Admitting: Neurology

## 2016-08-04 NOTE — Telephone Encounter (Signed)
-----   Message from Pieter Partridge, DO sent at 08/04/2016 11:41 AM EST ----- MRIs are stable (no progression since 2015)

## 2016-08-04 NOTE — Telephone Encounter (Signed)
Patient left message on voice mail for someone to call her back please call her at [phone]

## 2016-08-04 NOTE — Telephone Encounter (Signed)
Called patient. Gave MRI results. Patient verbalized understanding.  

## 2016-08-05 DIAGNOSIS — Z0000 Encounter for general adult medical examination without abnormal findings: Secondary | ICD-10-CM | POA: Diagnosis not present

## 2016-08-05 DIAGNOSIS — G35 Multiple sclerosis: Secondary | ICD-10-CM | POA: Diagnosis not present

## 2016-08-05 DIAGNOSIS — E7800 Pure hypercholesterolemia, unspecified: Secondary | ICD-10-CM | POA: Diagnosis not present

## 2016-08-05 DIAGNOSIS — Z23 Encounter for immunization: Secondary | ICD-10-CM | POA: Diagnosis not present

## 2016-08-05 DIAGNOSIS — Z1389 Encounter for screening for other disorder: Secondary | ICD-10-CM | POA: Diagnosis not present

## 2016-08-05 DIAGNOSIS — I10 Essential (primary) hypertension: Secondary | ICD-10-CM | POA: Diagnosis not present

## 2016-08-05 NOTE — Telephone Encounter (Signed)
Returned call. No answer.  

## 2016-08-12 NOTE — Telephone Encounter (Signed)
-----   Message from Pieter Partridge, DO sent at 08/04/2016 11:41 AM EST ----- MRIs are stable (no progression since 2015)

## 2016-08-12 NOTE — Telephone Encounter (Signed)
Done

## 2016-08-13 ENCOUNTER — Ambulatory Visit (INDEPENDENT_AMBULATORY_CARE_PROVIDER_SITE_OTHER): Payer: Medicare Other | Admitting: Cardiology

## 2016-08-13 ENCOUNTER — Encounter: Payer: Self-pay | Admitting: Cardiology

## 2016-08-13 ENCOUNTER — Encounter (INDEPENDENT_AMBULATORY_CARE_PROVIDER_SITE_OTHER): Payer: Self-pay

## 2016-08-13 VITALS — BP 120/70 | HR 74 | Ht 67.5 in | Wt 173.0 lb

## 2016-08-13 DIAGNOSIS — I5032 Chronic diastolic (congestive) heart failure: Secondary | ICD-10-CM

## 2016-08-13 DIAGNOSIS — I10 Essential (primary) hypertension: Secondary | ICD-10-CM | POA: Diagnosis not present

## 2016-08-13 DIAGNOSIS — I739 Peripheral vascular disease, unspecified: Secondary | ICD-10-CM

## 2016-08-13 DIAGNOSIS — I779 Disorder of arteries and arterioles, unspecified: Secondary | ICD-10-CM

## 2016-08-13 DIAGNOSIS — I447 Left bundle-branch block, unspecified: Secondary | ICD-10-CM | POA: Diagnosis not present

## 2016-08-13 NOTE — Patient Instructions (Signed)
Medication Instructions:  Your physician recommends that you continue on your current medications as directed. Please refer to the Current Medication list given to you today.   Labwork: None  Testing/Procedures: Your physician has requested that you have a carotid duplex. This test is an ultrasound of the carotid arteries in your neck. It looks at blood flow through these arteries that supply the brain with blood. Allow one hour for this exam. There are no restrictions or special instructions.  Follow-Up: Your physician wants you to follow-up in: 1 year with Dr. Turner. You will receive a reminder letter in the mail two months in advance. If you don't receive a letter, please call our office to schedule the follow-up appointment.   Any Other Special Instructions Will Be Listed Below (If Applicable).     If you need a refill on your cardiac medications before your next appointment, please call your pharmacy.   

## 2016-08-13 NOTE — Progress Notes (Signed)
Cardiology Office Note    Date:  08/13/2016   ID:  Zynae, Herek 1945-07-27, MRN [id number]  PCP:  Irven Shelling, MD  Cardiologist:  Fransico Him, MD   Chief Complaint  Patient presents with  . Hypertension  . Congestive Heart Failure    History of Present Illness:  Kristina Donovan is a 71 y.o. female  with a history of HTN, chronic LBBB, dyslipidemia, carotid artery stenosis and chronic diastolic CHF who presents today for followup. She is doing well.  She denies any chest pain or pressure, SOB, DOE, LE edema, dizziness, palpitations or syncope. She denies any claudication.   Past Medical History:  Diagnosis Date  . B12 deficiency    w chronic gastritis  . Carotid artery disease ([place]) 08/13/2016  . Carotid artery stenosis 2008   mild bilateral  . Chronic diastolic CHF (congestive heart failure) ([place])   . Elbow fracture   . Fecal incontinence    Hx of this  . Fracture, ankle    right  . Fracture, foot    left  . Hyperlipidemia   . Hypertension   . IBS (irritable bowel syndrome)   . LBBB (left bundle branch block) 01/11/2014  . Lung nodule    right lower lung  . Mild mitral regurgitation   . Multiple sclerosis, relapsing-remitting ([place])   . Perennial allergic rhinitis   . Urinary, incontinence, stress female     Past Surgical History:  Procedure Laterality Date  . CARDIAC CATHETERIZATION  2004   Normal coronary arteries, chronic Exertional dyspnea, cardiolite 2008 normal  . CHOLECYSTECTOMY  07/2000  . SALPINGOOPHORECTOMY     unilateral  . VAGINAL HYSTERECTOMY     and enterocele repair    Current Medications: Outpatient Medications Prior to Visit  Medication Sig Dispense Refill  . Ascorbic Acid (VITAMIN C) 100 MG tablet Take 100 mg by mouth daily.    . calcium carbonate (OS-CAL) 600 MG TABS tablet Take 600 mg by mouth 2 (two) times daily with a meal.    . COPAXONE 40 MG/ML SOSY INJECT ONE SYRINGE (40 MG) SUBCUTANEOUSLY THREE TIMES PER WEEK  AT LEAST 48 HOURS APART. ALLOW SYRINGE TO WARM TO ROOM TEMPERATURE FOR 20 MIN 12 Syringe 6  . lisinopril-hydrochlorothiazide (PRINZIDE,ZESTORETIC) 10-12.5 MG per tablet Take 1 tablet by mouth daily.    Marland Kitchen loperamide (IMODIUM A-D) 2 MG tablet Take 2 mg by mouth daily.    . pravastatin (PRAVACHOL) 40 MG tablet Take 40 mg by mouth daily.    . tamsulosin (FLOMAX) 0.4 MG CAPS capsule Take 0.4 mg by mouth daily.    . vitamin B-12 (CYANOCOBALAMIN) 1000 MCG tablet Take 1,000 mcg by mouth every other day. Three times a week     No facility-administered medications prior to visit.      Allergies:   Shellfish allergy; Fluzone [flu virus vaccine]; Nitrofuran derivatives; and Sulfa antibiotics   Social History   Social History  . Marital status: Married    Spouse name: N/A  . Number of children: N/A  . Years of education: N/A   Social History Main Topics  . Smoking status: Never Smoker  . Smokeless tobacco: Never Used  . Alcohol use No  . Drug use: No  . Sexual activity: No   Other Topics Concern  . None   Social History Narrative  . None     Family History:  The patient's family history includes Breast cancer in her mother; CAD in her brother and  father; Heart disease in her father; Hyperlipidemia in her father; Hypertension in her father.   ROS:   Please see the history of present illness.    ROS All other systems reviewed and are negative.  No flowsheet data found.     PHYSICAL EXAM:   VS:  BP 120/70   Pulse 74   Ht 5' 7.5" (1.715 m)   Wt 173 lb (78.5 kg)   SpO2 98%   BMI 26.70 kg/m    GEN: Well nourished, well developed, in no acute distress  HEENT: normal  Neck: no JVD, carotid bruits, or masses Cardiac: RRR; no murmurs, rubs, or gallops,no edema.  Intact distal pulses bilaterally.  Respiratory:  clear to auscultation bilaterally, normal work of breathing GI: soft, nontender, nondistended, + BS MS: no deformity or atrophy  Skin: warm and dry, no rash Neuro:  Alert  and Oriented x 3, Strength and sensation are intact Psych: euthymic mood, full affect  Wt Readings from Last 3 Encounters:  08/13/16 173 lb (78.5 kg)  07/24/16 173 lb 12.8 oz (78.8 kg)  02/14/16 171 lb (77.6 kg)      Studies/Labs Reviewed:   EKG:  EKG is not ordered today.   Recent Labs: No results found for requested labs within last 8760 hours.   Lipid Panel No results found for: CHOL, TRIG, HDL, CHOLHDL, VLDL, LDLCALC, LDLDIRECT  Additional studies/ records that were reviewed today include:  none    ASSESSMENT:    1. Chronic diastolic CHF (congestive heart failure), NYHA class 1 ([place])   2. Essential hypertension, benign   3. LBBB (left bundle branch block)   4. Bilateral carotid artery disease ([place])      PLAN:  In order of problems listed above:  1. Chronic diastolic CHF - she is asymptomatic and appears euvolemic on exam today. Weight is stable. Continue diuretic.   2. HTN - BP controlled on current meds. She will continue ACE I and diuretic.  3. LBBB - normal LVF 4. Bilateral carotid artery stenosis - I will repeat carotid dopplers.    Medication Adjustments/Labs and Tests Ordered: Current medicines are reviewed at length with the patient today.  Concerns regarding medicines are outlined above.  Medication changes, Labs and Tests ordered today are listed in the Patient Instructions below.  Patient Instructions  Medication Instructions:  Your physician recommends that you continue on your current medications as directed. Please refer to the Current Medication list given to you today.   Labwork: None  Testing/Procedures: Your physician has requested that you have a carotid duplex. This test is an ultrasound of the carotid arteries in your neck. It looks at blood flow through these arteries that supply the brain with blood. Allow one hour for this exam. There are no restrictions or special instructions.  Follow-Up: Your physician wants you to follow-up in:  1 year with Dr. Radford Pax. You will receive a reminder letter in the mail two months in advance. If you don't receive a letter, please call our office to schedule the follow-up appointment.   Any Other Special Instructions Will Be Listed Below (If Applicable).     If you need a refill on your cardiac medications before your next appointment, please call your pharmacy.      Signed, Fransico Him, MD  08/13/2016 11:09 AM    [facility] [address], [place], [place]  [address] Phone: [phone]; Fax: [phone]

## 2016-08-14 ENCOUNTER — Ambulatory Visit: Payer: Medicare Other | Admitting: Neurology

## 2016-08-18 ENCOUNTER — Ambulatory Visit
Admission: RE | Admit: 2016-08-18 | Discharge: 2016-08-18 | Disposition: A | Discharge status: Home / Self Care | Payer: Medicare Other | Source: Ambulatory Visit | Attending: Internal Medicine | Admitting: Internal Medicine

## 2016-08-18 DIAGNOSIS — Z1231 Encounter for screening mammogram for malignant neoplasm of breast: Secondary | ICD-10-CM

## 2016-08-18 DIAGNOSIS — N319 Neuromuscular dysfunction of bladder, unspecified: Secondary | ICD-10-CM | POA: Diagnosis not present

## 2016-08-18 DIAGNOSIS — N3941 Urge incontinence: Secondary | ICD-10-CM | POA: Diagnosis not present

## 2016-08-21 ENCOUNTER — Telehealth: Payer: Self-pay

## 2016-08-21 DIAGNOSIS — I10 Essential (primary) hypertension: Secondary | ICD-10-CM

## 2016-08-21 MED ORDER — POTASSIUM CHLORIDE CRYS ER 20 MEQ PO TBCR: 20.0000 meq | EXTENDED_RELEASE_TABLET | Freq: Every day | ORAL | 11 refills | Status: DC

## 2016-08-21 NOTE — Telephone Encounter (Signed)
Instructed patient to START KDUR 20 meq daily. BMET to be drawn 2/9. Patient agrees with treatment plan.

## 2016-08-21 NOTE — Telephone Encounter (Signed)
-----   Message from Sueanne Margarita, MD sent at 08/21/2016  9:46 AM EST ----- Start Kdur 60meq daily and repeat BMET in 1 week since K+ was 3.5

## 2016-08-22 ENCOUNTER — Ambulatory Visit (HOSPITAL_COMMUNITY)
Admission: RE | Admit: 2016-08-22 | Discharge: 2016-08-22 | Disposition: A | Discharge status: Home / Self Care | Payer: Medicare Other | Source: Ambulatory Visit | Attending: Cardiology | Admitting: Cardiology

## 2016-08-22 DIAGNOSIS — I779 Disorder of arteries and arterioles, unspecified: Secondary | ICD-10-CM | POA: Insufficient documentation

## 2016-08-22 DIAGNOSIS — I739 Peripheral vascular disease, unspecified: Secondary | ICD-10-CM

## 2016-08-23 ENCOUNTER — Encounter: Payer: Self-pay | Admitting: Cardiology

## 2016-08-25 ENCOUNTER — Telehealth: Payer: Self-pay | Admitting: Neurology

## 2016-08-25 NOTE — Telephone Encounter (Signed)
Kristina Donovan 09-11-2045. Her # is [id number]. The answering service left a message that she was needing to speak with someone regarding a prescription and her insurance. Thank you

## 2016-08-26 MED ORDER — COPAXONE 40 MG/ML ~~LOC~~ SOSY: PREFILLED_SYRINGE | SUBCUTANEOUS | 6 refills | Status: DC

## 2016-08-26 NOTE — Telephone Encounter (Signed)
Patient called to inform our office that she was able to get funding for Copaxone. Congratulated patient. Advised would send Rx to [facility]. Patient agreed.

## 2016-08-29 ENCOUNTER — Encounter (INDEPENDENT_AMBULATORY_CARE_PROVIDER_SITE_OTHER): Payer: Self-pay

## 2016-08-29 ENCOUNTER — Other Ambulatory Visit: Payer: Medicare Other | Admitting: *Deleted

## 2016-08-29 DIAGNOSIS — I10 Essential (primary) hypertension: Secondary | ICD-10-CM | POA: Diagnosis not present

## 2016-08-29 LAB — BASIC METABOLIC PANEL
BUN / CREAT RATIO: 23 (ref 12–28)
BUN: 21 mg/dL (ref 8–27)
CO2: 25 mmol/L (ref 18–29)
Calcium: 9.5 mg/dL (ref 8.7–10.3)
Chloride: 97 mmol/L (ref 96–106)
Creatinine, Ser: 0.93 mg/dL (ref 0.57–1.00)
GFR calc Af Amer: 73 mL/min/{1.73_m2} (ref >59)
GFR calc non Af Amer: 63 mL/min/{1.73_m2} (ref >59)
Glucose: 103 mg/dL — ABNORMAL HIGH (ref 65–99)
POTASSIUM: 3.9 mmol/L (ref 3.5–5.2)
SODIUM: 140 mmol/L (ref 134–144)

## 2016-09-02 ENCOUNTER — Ambulatory Visit (INDEPENDENT_AMBULATORY_CARE_PROVIDER_SITE_OTHER): Payer: Medicare Other | Admitting: Podiatry

## 2016-09-02 ENCOUNTER — Encounter: Payer: Self-pay | Admitting: Podiatry

## 2016-09-02 DIAGNOSIS — B351 Tinea unguium: Secondary | ICD-10-CM | POA: Diagnosis not present

## 2016-09-02 DIAGNOSIS — M79676 Pain in unspecified toe(s): Secondary | ICD-10-CM

## 2016-09-02 NOTE — Progress Notes (Signed)
She presents today with thick yellow dystrophic with mycotic nails 1 through 5 right foot. She states she does have an trim.  Objective: Toenails 1 into 4 and 5 of the right foot are thick yellow dystrophic with mycotic. Pulses remain palpable.  Assessment: Slowly improving onychomycosis right foot.  Plan: Debridement of toenails right foot. Follow up with her in 1 month's

## 2016-09-30 ENCOUNTER — Ambulatory Visit: Payer: Medicare Other | Admitting: Podiatry

## 2016-10-12 DIAGNOSIS — J069 Acute upper respiratory infection, unspecified: Secondary | ICD-10-CM | POA: Diagnosis not present

## 2016-10-14 ENCOUNTER — Encounter (INDEPENDENT_AMBULATORY_CARE_PROVIDER_SITE_OTHER): Payer: Medicare Other | Admitting: Podiatry

## 2016-10-14 NOTE — Progress Notes (Signed)
This encounter was created in error - please disregard.

## 2016-10-20 ENCOUNTER — Other Ambulatory Visit: Payer: Self-pay | Admitting: Internal Medicine

## 2016-10-20 ENCOUNTER — Ambulatory Visit
Admission: RE | Admit: 2016-10-20 | Discharge: 2016-10-20 | Disposition: A | Discharge status: Home / Self Care | Payer: Medicare Other | Source: Ambulatory Visit | Attending: Internal Medicine | Admitting: Internal Medicine

## 2016-10-20 DIAGNOSIS — J9801 Acute bronchospasm: Secondary | ICD-10-CM | POA: Diagnosis not present

## 2016-10-20 DIAGNOSIS — R05 Cough: Secondary | ICD-10-CM

## 2016-10-20 DIAGNOSIS — J209 Acute bronchitis, unspecified: Secondary | ICD-10-CM | POA: Diagnosis not present

## 2016-10-20 DIAGNOSIS — R059 Cough, unspecified: Secondary | ICD-10-CM

## 2016-12-16 ENCOUNTER — Encounter: Payer: Self-pay | Admitting: Podiatry

## 2016-12-16 ENCOUNTER — Ambulatory Visit (INDEPENDENT_AMBULATORY_CARE_PROVIDER_SITE_OTHER): Payer: Medicare Other | Admitting: Podiatry

## 2016-12-16 DIAGNOSIS — B351 Tinea unguium: Secondary | ICD-10-CM

## 2016-12-16 DIAGNOSIS — M79676 Pain in unspecified toe(s): Secondary | ICD-10-CM | POA: Diagnosis not present

## 2016-12-16 NOTE — Progress Notes (Signed)
She presents today to complaint of painful elongated toenails. She's had a history of bronchitis recently so she was unable to come in for her routine nail debridement.  Objective: Toenails are long thick yellow dystrophic, mycotic right foot only.  Assessment: Pain elicited onychomycosis.  Plan: Debridement of toenails 1 through 5 right foot only.

## 2016-12-24 ENCOUNTER — Telehealth: Payer: Self-pay | Admitting: *Deleted

## 2016-12-24 NOTE — Telephone Encounter (Signed)
Can patient have refills of Copaxone, we received a fax from [facility] phone is [phone]   Fax is [phone]  If this is ok'd Send to [place] to fax back You'll have to sign the form Ill leave it on the desk  Thanks

## 2016-12-25 NOTE — Telephone Encounter (Signed)
OK to refill Copaxone

## 2016-12-25 NOTE — Telephone Encounter (Signed)
The form we received was actually a request to refill the syringes, not the actual medication.  Dr. Tomi Likens signed and I faxed it to the requested number.

## 2017-01-08 ENCOUNTER — Ambulatory Visit (INDEPENDENT_AMBULATORY_CARE_PROVIDER_SITE_OTHER): Payer: Medicare Other | Admitting: Podiatry

## 2017-01-08 ENCOUNTER — Encounter: Payer: Self-pay | Admitting: Podiatry

## 2017-01-08 DIAGNOSIS — B351 Tinea unguium: Secondary | ICD-10-CM

## 2017-01-08 DIAGNOSIS — M79676 Pain in unspecified toe(s): Secondary | ICD-10-CM

## 2017-01-08 NOTE — Progress Notes (Signed)
She presents complaining of painful thick nails toenails 1 through 5 of the right foot.  Objective: Vital signs are stable she is alert and oriented 3. Pulses are palpable. She has thick yellow dystrophic onychomycotic nails 1 through 5 of the right foot.  Assessment: Pain limp secondary to onychomycosis.  Plan: Toenails 1 through 5 of the right foot. Suggested that she start utilizing formula 3.

## 2017-01-19 DIAGNOSIS — L700 Acne vulgaris: Secondary | ICD-10-CM | POA: Diagnosis not present

## 2017-02-10 ENCOUNTER — Encounter (INDEPENDENT_AMBULATORY_CARE_PROVIDER_SITE_OTHER): Payer: Medicare Other | Admitting: Podiatry

## 2017-02-10 ENCOUNTER — Encounter: Payer: Self-pay | Admitting: Podiatry

## 2017-02-10 DIAGNOSIS — M79676 Pain in unspecified toe(s): Secondary | ICD-10-CM

## 2017-02-10 DIAGNOSIS — B351 Tinea unguium: Secondary | ICD-10-CM

## 2017-02-13 DIAGNOSIS — I10 Essential (primary) hypertension: Secondary | ICD-10-CM | POA: Diagnosis not present

## 2017-02-13 DIAGNOSIS — M810 Age-related osteoporosis without current pathological fracture: Secondary | ICD-10-CM | POA: Diagnosis not present

## 2017-02-18 NOTE — Progress Notes (Signed)
This encounter was created in error - please disregard.

## 2017-03-02 ENCOUNTER — Other Ambulatory Visit (HOSPITAL_COMMUNITY): Payer: Self-pay | Admitting: *Deleted

## 2017-03-03 ENCOUNTER — Ambulatory Visit (HOSPITAL_COMMUNITY)
Admission: RE | Admit: 2017-03-03 | Discharge: 2017-03-03 | Disposition: A | Discharge status: Home / Self Care | Payer: Medicare Other | Source: Ambulatory Visit | Attending: Internal Medicine | Admitting: Internal Medicine

## 2017-03-03 DIAGNOSIS — M810 Age-related osteoporosis without current pathological fracture: Secondary | ICD-10-CM | POA: Diagnosis present

## 2017-03-03 MED ORDER — ZOLEDRONIC ACID 5 MG/100ML IV SOLN
INTRAVENOUS | Status: AC
  Filled 2017-03-03: qty 100

## 2017-03-03 MED ORDER — ZOLEDRONIC ACID 5 MG/100ML IV SOLN
5.0000 mg | Freq: Once | INTRAVENOUS | Status: AC
  Administered 2017-03-03: 5 mg via INTRAVENOUS

## 2017-03-11 ENCOUNTER — Other Ambulatory Visit: Payer: Self-pay | Admitting: Neurology

## 2017-03-17 ENCOUNTER — Encounter: Payer: Self-pay | Admitting: Podiatry

## 2017-03-17 ENCOUNTER — Ambulatory Visit (INDEPENDENT_AMBULATORY_CARE_PROVIDER_SITE_OTHER): Payer: Medicare Other | Admitting: Podiatry

## 2017-03-17 DIAGNOSIS — M79676 Pain in unspecified toe(s): Secondary | ICD-10-CM

## 2017-03-17 DIAGNOSIS — B351 Tinea unguium: Secondary | ICD-10-CM | POA: Diagnosis not present

## 2017-03-17 NOTE — Progress Notes (Signed)
She presents today chief complaint of elongated toenails 1 through 5 of the right foot.  Objective: Pulses are palpable no open lesions or wounds. Capillary fill time is immediate and no neurological changes. Toenails 1 through 5 of the right foot are thick yellow dystrophic onychomycotic painful on palpation as well as debridement.  Assessment: Pain in limb secondary to onychomycosis.  Plan: Debridement of toenails 1 through 5 right foot.

## 2017-06-15 DIAGNOSIS — M810 Age-related osteoporosis without current pathological fracture: Secondary | ICD-10-CM | POA: Diagnosis not present

## 2017-06-15 DIAGNOSIS — I10 Essential (primary) hypertension: Secondary | ICD-10-CM | POA: Diagnosis not present

## 2017-06-15 DIAGNOSIS — D510 Vitamin B12 deficiency anemia due to intrinsic factor deficiency: Secondary | ICD-10-CM | POA: Diagnosis not present

## 2017-06-18 ENCOUNTER — Encounter: Payer: Self-pay | Admitting: Podiatry

## 2017-06-18 ENCOUNTER — Ambulatory Visit (INDEPENDENT_AMBULATORY_CARE_PROVIDER_SITE_OTHER): Payer: Medicare Other | Admitting: Podiatry

## 2017-06-18 DIAGNOSIS — B351 Tinea unguium: Secondary | ICD-10-CM | POA: Diagnosis not present

## 2017-06-18 DIAGNOSIS — M79676 Pain in unspecified toe(s): Secondary | ICD-10-CM

## 2017-06-18 NOTE — Progress Notes (Signed)
She presents today for follow-up painful elongated toenails 1 through 5 of the right foot only. She says they bother her with her shoe gear.  Objective: Toenails are long thick yellow dystrophic with mycotic painful on palpation as well as debridement 1 through 5 of the right foot.  Assessment: Pain and limp secondary to onychomycosis.  Plan: Debridement of toenails 1 through 5 of the right foot only. Follow up with her in 3-4 months.

## 2017-07-09 DIAGNOSIS — H5703 Miosis: Secondary | ICD-10-CM | POA: Diagnosis not present

## 2017-07-09 DIAGNOSIS — H2511 Age-related nuclear cataract, right eye: Secondary | ICD-10-CM | POA: Diagnosis not present

## 2017-07-09 DIAGNOSIS — H04123 Dry eye syndrome of bilateral lacrimal glands: Secondary | ICD-10-CM | POA: Diagnosis not present

## 2017-07-09 DIAGNOSIS — H2181 Floppy iris syndrome: Secondary | ICD-10-CM | POA: Diagnosis not present

## 2017-07-09 DIAGNOSIS — H25812 Combined forms of age-related cataract, left eye: Secondary | ICD-10-CM | POA: Diagnosis not present

## 2017-07-10 ENCOUNTER — Other Ambulatory Visit: Payer: Self-pay | Admitting: Internal Medicine

## 2017-07-10 DIAGNOSIS — Z1231 Encounter for screening mammogram for malignant neoplasm of breast: Secondary | ICD-10-CM

## 2017-07-14 IMAGING — MR MR HEAD WO/W CM
19 of 21 series · 37 of 48 positions shown · IV contrast (15ml multihance)
Comparison: Brain MRI 03/23/2014

CLINICAL DATA: Multiple sclerosis

Creatinine was obtained on site at [HOSPITAL] at [HOSPITAL].
Results: Creatinine 1.1 mg/dL.
EXAM:
MRI HEAD WITHOUT AND WITH CONTRAST
TECHNIQUE: Multiplanar, multiecho pulse sequences of the brain and surrounding
structures were obtained without and with intravenous contrast.
CONTRAST:  15 mL MultiHance IV

[Series 2: T1 · sagittal · 5.0mm · 0.45mm/px · 2 of 21 slices shown (1 of 3)]
[im 1/21]
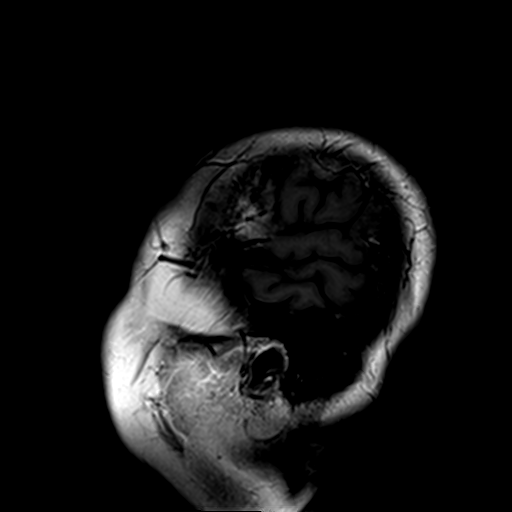
[im 21/21]
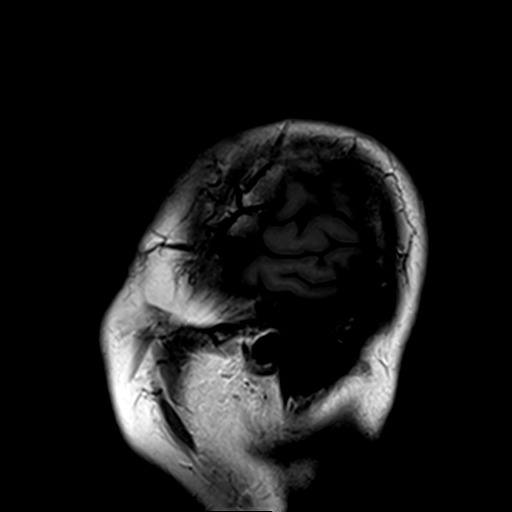

[Series 3: DWI · axial · 3.0mm · 1.80mm/px · z∈[-50,+97]mm · 5 of 100 slices shown (1 of 4)]
[im 1/100]
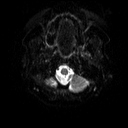
[im 25/100]
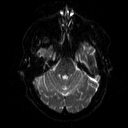
[im 50/100]
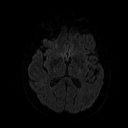
[im 75/100]
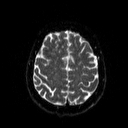
[im 100/100]
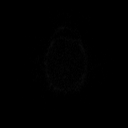

[Series 4: DWI · axial · 3.0mm · 1.80mm/px · z∈[-50,+97]mm · 3 of 50 slices shown (2 of 4)]
[im 1/50]
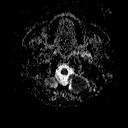
[im 25/50]
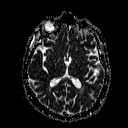
[im 50/50]
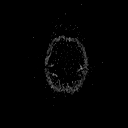

[Series 6: swi_images · axial · 2.0mm · 0.90mm/px · z∈[-56,+102]mm · 4 of 80 slices shown]
[im 1/80]
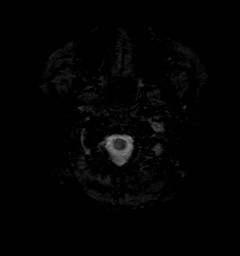
[im 27/80]
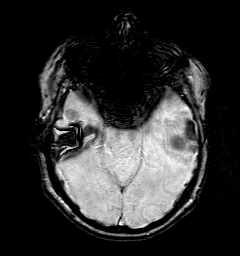
[im 53/80]
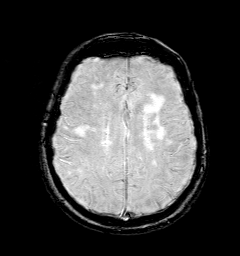
[im 80/80]
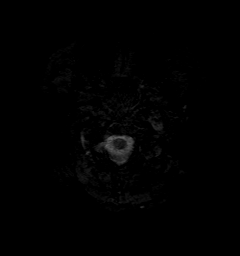

[Series 7: DWI · coronal · 5.0mm · 1.80mm/px · 3 of 64 slices shown (3 of 4)]
[im 1/64]
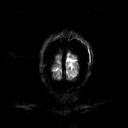
[im 32/64]
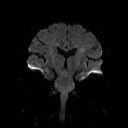
[im 64/64]
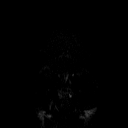

[Series 8: DWI · coronal · 5.0mm · 1.80mm/px · 2 of 34 slices shown (4 of 4)]
[im 1/34]
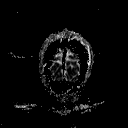
[im 34/34]
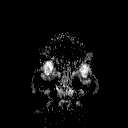

[Series 9: FLAIR · sagittal · 5.0mm · 0.45mm/px · 1 of 25 slices shown (1 of 2)]
[im 1/25]
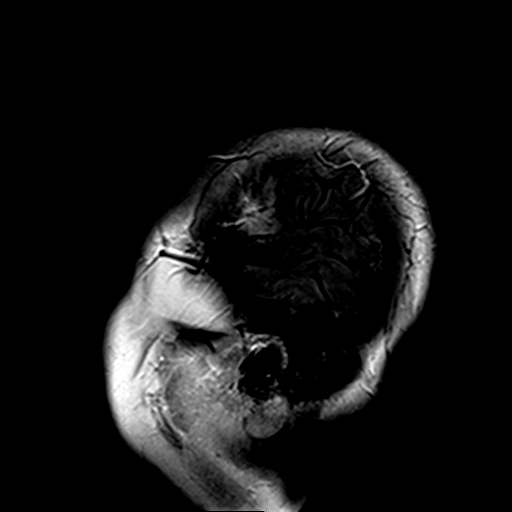

[Series 10: T2 · axial · 5.0mm · 0.51mm/px · 1 of 22 slices shown (1 of 5)]
[im 1/22]
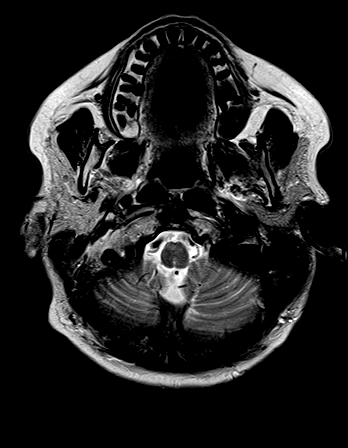

[Series 11: FLAIR · axial · 3.0mm · 0.45mm/px · z∈[-50,+97]mm · 3 of 50 slices shown (2 of 2)]
[im 1/50]
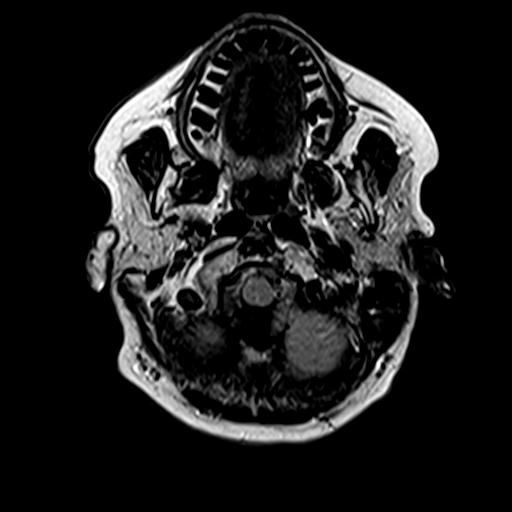
[im 25/50]
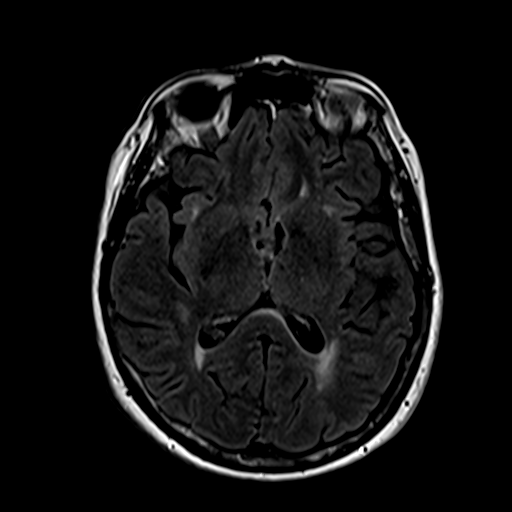
[im 50/50]
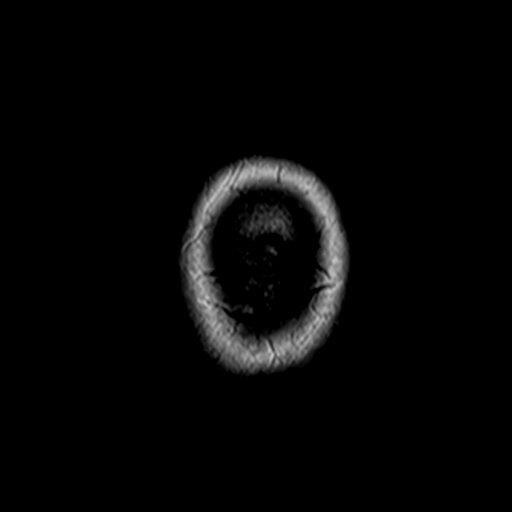

[Series 12: t1_mpr_tra · axial · 1.0mm · 0.45mm/px · z∈[-49,+22]mm · 4 of 144 slices shown]
[im 1/144]
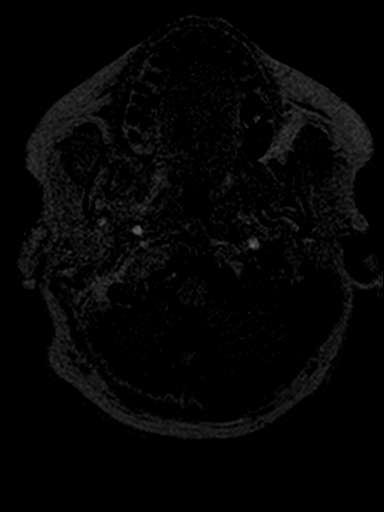
[im 24/144]
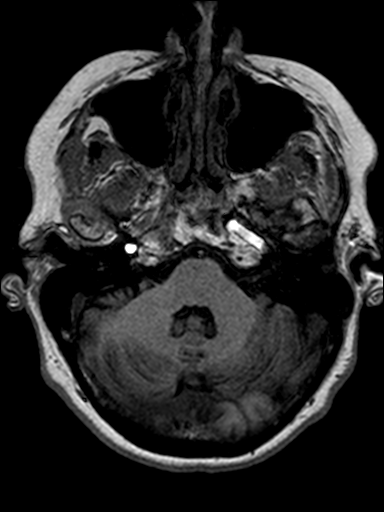
[im 48/144]
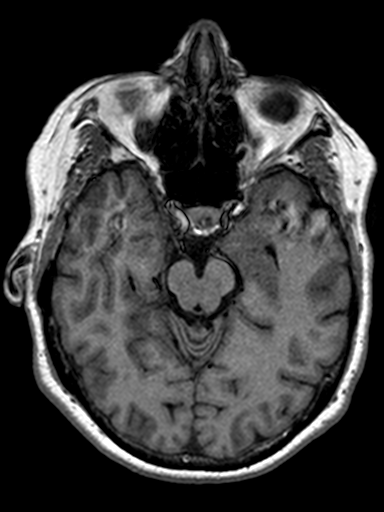
[im 72/144]
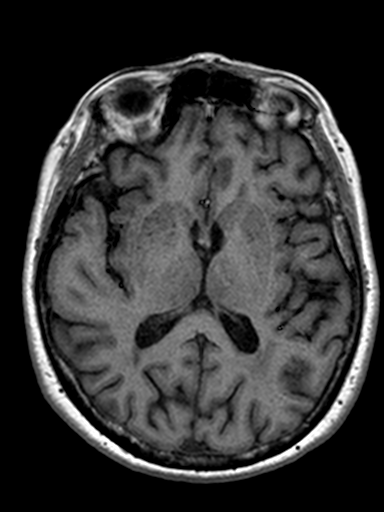

[Series 13: T2 · coronal · 5.0mm · 0.45mm/px · 1 of 25 slices shown (2 of 5)]
[im 1/25]
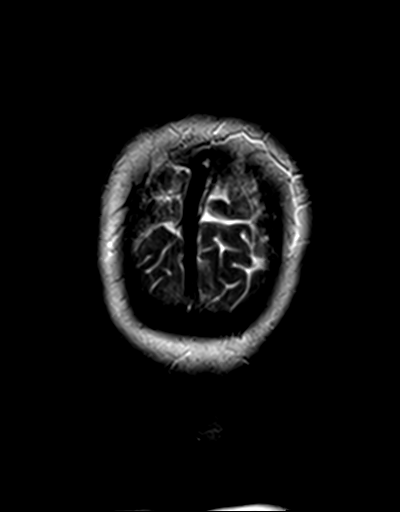

[Series 15: STIR · sagittal · 3.0mm · 0.82mm/px · 1 of 12 slices shown]
[im 1/12]
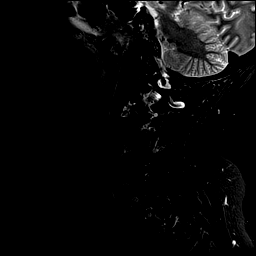

[Series 16: T1 · sagittal · 3.0mm · 0.41mm/px · 1 of 12 slices shown (2 of 3)]
[im 1/12]
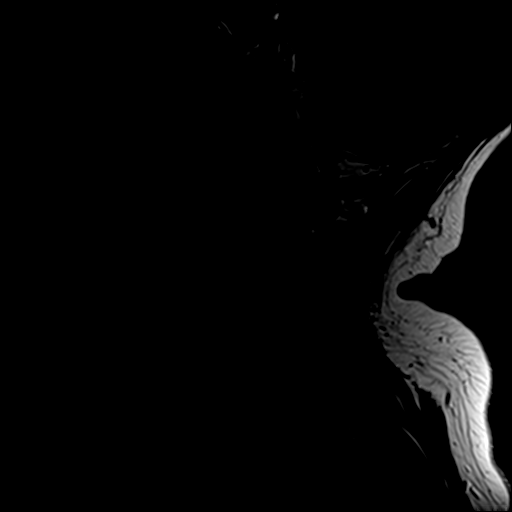

[Series 17: T2 · axial · 3.0mm · 0.39mm/px · 1 of 26 slices shown (3 of 5)]
[im 1/26]
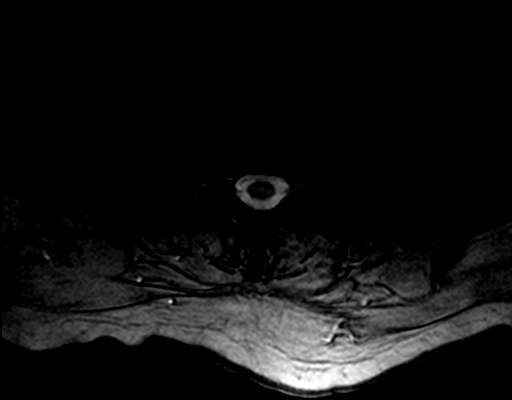

[Series 18: T2 · axial · 3.0mm · 0.39mm/px · 1 of 26 slices shown (4 of 5)]
[im 1/26]
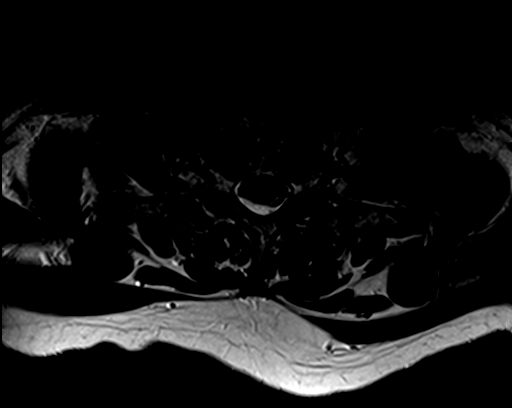

[Series 19: T1 · axial · 3.0mm · 0.35mm/px · 1 of 26 slices shown (3 of 3)]
[im 1/26]
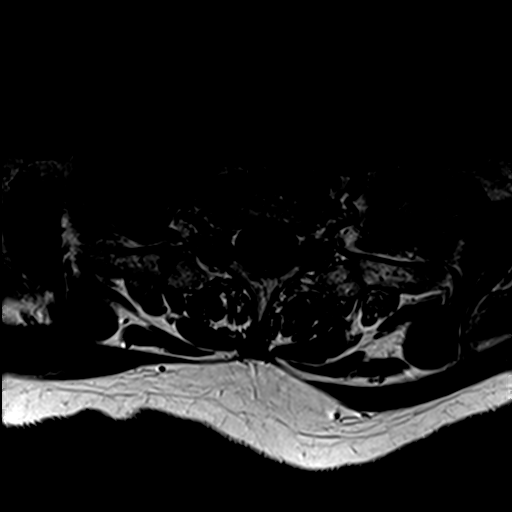

[Series 20: T2 · sagittal · 3.0mm · 0.41mm/px · 1 of 12 slices shown (5 of 5)]
[im 1/12]
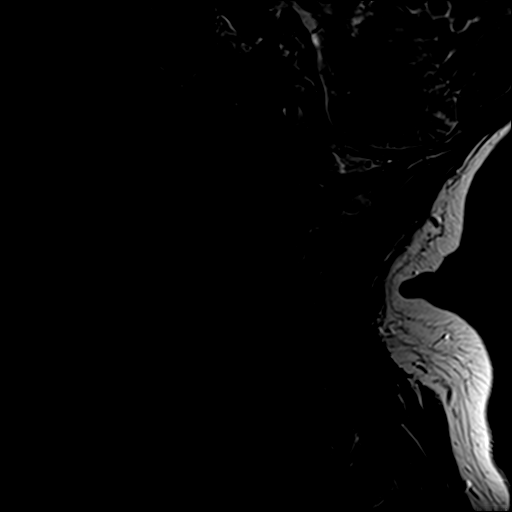

[Series 21: T1 fat-sat post-contrast · sagittal · 3.0mm · 0.82mm/px · 1 of 12 slices shown]
[im 1/12]
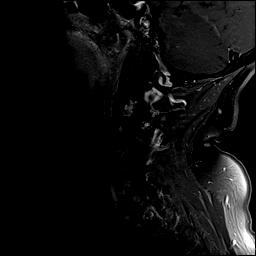

[Series 22: T1 post-contrast · axial · 3.0mm · 0.35mm/px · 1 of 26 slices shown]
[im 1/26]
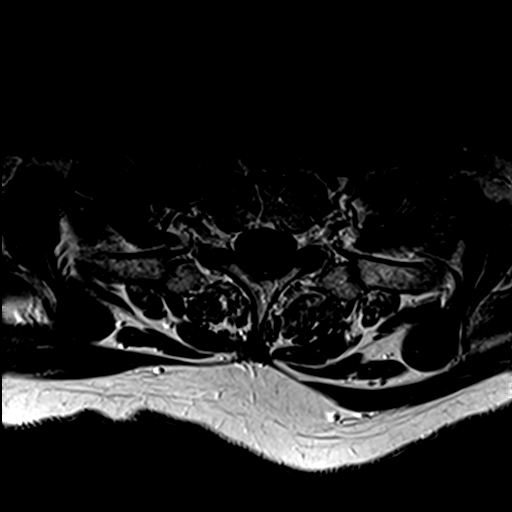

[37 of 48 positions shown; findings below may reference images not displayed]

FINDINGS: Brain: No focal diffusion restriction to indicate acute infarct. No
intraparenchymal hemorrhage. There are numerous hyperintense T2
weighted signal white matter lesions, many of which involve the
pericallosal white matter and are oriented perpendicularly to the
long axis of the lateral ventricles. The distribution of lesions is
unchanged. There are no contrast-enhancing lesions. No mass lesion
or midline shift. No hydrocephalus or extra-axial fluid collection.
There are approximately 5 low T1 weighted signal lesions. No
discrete infratentorial lesions. No age advanced or lobar
predominant atrophy.

Vascular: Major intracranial arterial and venous sinus flow voids
are preserved. No evidence of chronic microhemorrhage or amyloid
angiopathy.

Skull and upper cervical spine: The visualized skull base,
calvarium, upper cervical spine and extracranial soft tissues are
normal.

Sinuses/Orbits: No fluid levels or advanced mucosal thickening. No
mastoid effusion. Normal orbits.
IMPRESSION: Unchanged distribution of multiple sclerosis lesions within the
supratentorial white matter. No new or active demyelinating lesions.

## 2017-07-24 ENCOUNTER — Ambulatory Visit (INDEPENDENT_AMBULATORY_CARE_PROVIDER_SITE_OTHER): Payer: Medicare Other | Admitting: Neurology

## 2017-07-24 ENCOUNTER — Encounter: Payer: Self-pay | Admitting: Neurology

## 2017-07-24 VITALS — BP 116/60 | HR 80 | Ht 67.5 in | Wt 176.4 lb

## 2017-07-24 DIAGNOSIS — G35 Multiple sclerosis: Secondary | ICD-10-CM | POA: Diagnosis not present

## 2017-07-24 NOTE — Patient Instructions (Signed)
1.  Continue Copaxone and vitamin D3 2.  Follow up with Dr. Laurann Montana about a urine test.  If you don't have new urinary tract symptoms (burning or increased urgency greater than usual) and the memory has gotten better, then it likely isn't necessary 3.  Recommend activities mentally stimulating (puzzles, learning new things via reading or documentaries). 4.  Keep socially active 5.  Routine exercise 6.  Mediterranean diet (see below) 7.  Follow up in one year   Mediterranean Diet A Mediterranean diet refers to food and lifestyle choices that are based on the traditions of countries located on the [employer]. This way of eating has been shown to help prevent certain conditions and improve outcomes for people who have chronic diseases, like kidney disease and heart disease. What are tips for following this plan? Lifestyle  Cook and eat meals together with your family, when possible.  Drink enough fluid to keep your urine clear or pale yellow.  Be physically active every day. This includes: ? Aerobic exercise like running or swimming. ? Leisure activities like gardening, walking, or housework.  Get 7-8 hours of sleep each night.  If recommended by your health care provider, drink red wine in moderation. This means 1 glass a day for nonpregnant women and 2 glasses a day for men. A glass of wine equals 5 oz (150 mL). Reading food labels  Check the serving size of packaged foods. For foods such as rice and pasta, the serving size refers to the amount of cooked product, not dry.  Check the total fat in packaged foods. Avoid foods that have saturated fat or trans fats.  Check the ingredients list for added sugars, such as corn syrup. Shopping  At the grocery store, buy most of your food from the areas near the walls of the store. This includes: ? Fresh fruits and vegetables (produce). ? Grains, beans, nuts, and seeds. Some of these may be available in unpackaged forms or large  amounts (in bulk). ? Fresh seafood. ? Poultry and eggs. ? Low-fat dairy products.  Buy whole ingredients instead of prepackaged foods.  Buy fresh fruits and vegetables in-season from local farmers markets.  Buy frozen fruits and vegetables in resealable bags.  If you do not have access to quality fresh seafood, buy precooked frozen shrimp or canned fish, such as tuna, salmon, or sardines.  Buy small amounts of raw or cooked vegetables, salads, or olives from the deli or salad bar at your store.  Stock your pantry so you always have certain foods on hand, such as olive oil, canned tuna, canned tomatoes, rice, pasta, and beans. Cooking  Cook foods with extra-virgin olive oil instead of using butter or other vegetable oils.  Have meat as a side dish, and have vegetables or grains as your main dish. This means having meat in small portions or adding small amounts of meat to foods like pasta or stew.  Use beans or vegetables instead of meat in common dishes like chili or lasagna.  Experiment with different cooking methods. Try roasting or broiling vegetables instead of steaming or sauteing them.  Add frozen vegetables to soups, stews, pasta, or rice.  Add nuts or seeds for added healthy fat at each meal. You can add these to yogurt, salads, or vegetable dishes.  Marinate fish or vegetables using olive oil, lemon juice, garlic, and fresh herbs. Meal planning  Plan to eat 1 vegetarian meal one day each week. Try to work up to 2 vegetarian meals,  if possible.  Eat seafood 2 or more times a week.  Have healthy snacks readily available, such as: ? Vegetable sticks with hummus. ? [place] yogurt. ? Fruit and nut trail mix.  Eat balanced meals throughout the week. This includes: ? Fruit: 2-3 servings a day ? Vegetables: 4-5 servings a day ? Low-fat dairy: 2 servings a day ? Fish, poultry, or lean meat: 1 serving a day ? Beans and legumes: 2 or more servings a week ? Nuts and seeds:  1-2 servings a day ? Whole grains: 6-8 servings a day ? Extra-virgin olive oil: 3-4 servings a day  Limit red meat and sweets to only a few servings a month What are my food choices?  Mediterranean diet ? Recommended ? Grains: Whole-grain pasta. Brown rice. Bulgar wheat. Polenta. Couscous. Whole-wheat bread. [place]. ? Vegetables: Artichokes. Beets. Broccoli. Cabbage. Carrots. Eggplant. Green beans. Chard. Kale. Spinach. Onions. Leeks. Peas. Squash. Tomatoes. Peppers. Radishes. ? Fruits: Apples. Apricots. Avocado. Berries. Bananas. Cherries. Dates. Figs. Grapes. Lemons. Melon. Oranges. Peaches. Plums. Pomegranate. ? Meats and other protein foods: Beans. Almonds. Sunflower seeds. Pine nuts. Peanuts. [place]. Salmon. Scallops. Shrimp. [place]. Tilapia. Clams. Oysters. Eggs. ? Dairy: Low-fat milk. Cheese. Greek yogurt. ? Beverages: Water. Red wine. Herbal tea. ? Fats and oils: Extra virgin olive oil. Avocado oil. Grape seed oil. ? Sweets and desserts: [place] yogurt with honey. Baked apples. Poached pears. Trail mix. ? Seasoning and other foods: Basil. Cilantro. Coriander. Cumin. Mint. Parsley. Sage. Rosemary. Tarragon. Garlic. Oregano. Thyme. Pepper. Balsalmic vinegar. Tahini. Hummus. Tomato sauce. Olives. Mushrooms. ? Limit these ? Grains: Prepackaged pasta or rice dishes. Prepackaged cereal with added sugar. ? Vegetables: Deep fried potatoes (french fries). ? Fruits: Fruit canned in syrup. ? Meats and other protein foods: Beef. Pork. Lamb. Poultry with skin. Hot dogs. [place] Salines. ? Dairy: Ice cream. Sour cream. Whole milk. ? Beverages: Juice. Sugar-sweetened soft drinks. Beer. Liquor and spirits. ? Fats and oils: Butter. Canola oil. Vegetable oil. Beef fat (tallow). Lard. ? Sweets and desserts: Cookies. Cakes. Pies. Candy. ? Seasoning and other foods: Mayonnaise. Premade sauces and marinades. ? The items listed may not be a complete list. Talk with your dietitian about what dietary choices are  right for you. Summary  The Mediterranean diet includes both food and lifestyle choices.  Eat a variety of fresh fruits and vegetables, beans, nuts, seeds, and whole grains.  Limit the amount of red meat and sweets that you eat.  Talk with your health care provider about whether it is safe for you to drink red wine in moderation. This means 1 glass a day for nonpregnant women and 2 glasses a day for men. A glass of wine equals 5 oz (150 mL). This information is not intended to replace advice given to you by your health care provider. Make sure you discuss any questions you have with your health care provider. Document Released: 02/28/2016 Document Revised: 04/01/2016 Document Reviewed: 02/28/2016 Elsevier Interactive Patient Education  [employer].

## 2017-07-24 NOTE — Progress Notes (Signed)
NEUROLOGY FOLLOW UP OFFICE NOTE  Kristina Donovan [id number]  HISTORY OF PRESENT ILLNESS: Kristina Donovan is a 72 year old right-handed woman with history of relapsing-remitting MS, hypertension, hyperlipidemia, mild carotid artery disease, LBBB, mild MR, IBS, history of fecal incontinence, urinary incontinence, LBBB, and diastolic dysfunction who follows up for relapsing-remitting MS.     UPDATE: She is taking Copaxone.  She was advised to increase D3 to 2000 IU daily. MRI of brain and cervical spine with and without contrast from 07/31/16 was personally reviewed and unchanged from prior MRI on 03/23/14 with no new or active lesions.   Visual:  Light sensitivity.  Having cataract surgery on Monday. Weakness:  no Sensory symptoms: no Gait/balance:  No change Bladder/bowel function:  No change.  Has increased urinary urgency/frequency.  On Flomax Cognitive/memory:  Over the holiday, she and her family noticed some short term memory problem.  There were several family events scheduled and she couldn't keep tract of the dates and times.  She felt increased stress related to the holiday and the snow in which she was cooped up in the house for several days.  She denies difficulty remembering to take medication, paying bills/managing finances and denies disorientation driving on familiar routes.  She maintains her house and performs all ADLs and errands.  Since the holiday is over, she states memory has improved.  She is wondering if she has a UTI.  She denies burning on urination or increased urinary frequency greater than usual. Mood/depression:  No  For about 6 months, she had about 6 episodes of dizziness.  It would occur when she just woke up in bed and was about to get up.  She described a spinning sensation usually lasting less than a minute but has lasted up to 4 to 5 minutes.  Thee was no associated double vision, slurred speech, nausea, vomiting or unilateral numbness or weakness.  She may  feel a little off balance for a few seconds but no ataxia.  Last event was 1 to 2 months ago.  HISTORY: She first exhibited symptoms in 1984. At that time she developed left leg numbness. In 1985, she developed right leg numbness. She then exhibited lower extremity numbness in 1986 as well. No definitive diagnosis was ever made at that time. She was doing well until 1997. At that time she developed leg numbness again. She reportedly had imaging of the brain, likely MRI with contrast. No lumbar puncture was performed. It was determined that she likely had multiple sclerosis but she was not started on a disease modifying agent because her symptoms had completely resolved.  In 2005, she had another flareup of left lower extremity numbness, this time associated with weakness. This followed a flu shot. She was not treated with IV steroids.  She had an MRI of the brain with and without contrast performed 10/16/03 for right sided numbness and weakness.  It revealed multiple, many ovoid-shaped, foci of hyperintensity in the bilateral periventricular white matter, including the callosal septal interface.  No abnormal enhancement.  MRA of the head was unremarkable.  She was started on Betaseron. She experienced flulike symptoms, but otherwise she tolerated it well. Since she did not have any further flareups, except for some mild symptoms of numbness, Betaseron was discontinued in 2013, since she was clinically and radiographically stable.  In August 2015, she developed sudden onset of vertical diplopia. She denied any blurred vision or eye pain. She denied any lower extremity symptoms.    In  November 2015, she had an episode where she lost consciousness, falling and hitting her face.  No seizure-like activity was witnessed.  She was not postictal.  EEG performed on 06/06/14 was normal.  CT of the head performed on 06/07/14 showed nothing acute.  She was evaluated by cardiology.  2D echo showed LVEF 55-60%.  She wore a  heart monitor for a week, which was unremarkable.  She has not had any other episodes of syncope.  Etiology is unknown but it may have been dehydration.      MRI of the brain with and without contrast from 03/23/14 was stable compared to prior study from 2005.  MRI of the cervical spine with and without contrast showed non-enhancing plaques at C2 and C3 levels, as well as degenerative arthritic changes.  PAST MEDICAL HISTORY: Past Medical History:  Diagnosis Date  . B12 deficiency    w chronic gastritis  . Carotid artery disease ([place]) 08/13/2016  . Carotid artery stenosis 2008   1-39% stenosis by dopplers 07/2016  . Chronic diastolic CHF (congestive heart failure) ([place])   . Elbow fracture   . Fecal incontinence    Hx of this  . Fracture, ankle    right  . Fracture, foot    left  . Hyperlipidemia   . Hypertension   . IBS (irritable bowel syndrome)   . LBBB (left bundle branch block) 01/11/2014  . Lung nodule    right lower lung  . Mild mitral regurgitation   . Multiple sclerosis, relapsing-remitting ([place])   . Perennial allergic rhinitis   . Urinary, incontinence, stress female     MEDICATIONS: Current Outpatient Medications on File Prior to Visit  Medication Sig Dispense Refill  . pyridOXINE (VITAMIN B-6) 100 MG tablet Take 100 mg by mouth daily.    . vitamin E 400 UNIT capsule Take 400 Units by mouth daily.    . zoledronic acid (RECLAST) 5 MG/100ML SOLN injection Inject 5 mg into the vein once.    . Ascorbic Acid (VITAMIN C) 100 MG tablet Take 100 mg by mouth daily.    Marland Kitchen aspirin EC 81 MG tablet Take 81 mg by mouth daily.    . calcium carbonate (OS-CAL) 600 MG TABS tablet Take 600 mg by mouth 2 (two) times daily with a meal.    . Cholecalciferol (VITAMIN D-3) 1000 units CAPS Take 2 capsules by mouth daily.    Marland Kitchen COPAXONE 40 MG/ML SOSY INJECT ONE SYRINGE (40 MG) SUBCUTANEOUSLY THREE TIMES PER WEEK AT LEAST 48 HOURS APART. ALLOW SYRINGE TO WARM TO ROOM TEMPERATURE FOR 20 MIN 12  Syringe 6  . COPAXONE 40 MG/ML SOSY INJECT ONE SYRINGE (40 MG) SUBCUTANEOUSLY THREE TIMES PER WEEK AT LEAST 48 HOURS APART. ALLOW SYRINGE TO WARM TO ROOM TEMPERATURE FOR 20 MIN 12 Syringe 5  . lisinopril-hydrochlorothiazide (PRINZIDE,ZESTORETIC) 10-12.5 MG per tablet Take 1 tablet by mouth daily.    Marland Kitchen loperamide (IMODIUM A-D) 2 MG tablet Take 2 mg by mouth daily.    . potassium chloride SA (K-DUR,KLOR-CON) 20 MEQ tablet Take 1 tablet (20 mEq total) by mouth daily. 30 tablet 11  . pravastatin (PRAVACHOL) 40 MG tablet Take 40 mg by mouth daily.    . tamsulosin (FLOMAX) 0.4 MG CAPS capsule Take 0.4 mg by mouth daily.    . vitamin B-12 (CYANOCOBALAMIN) 1000 MCG tablet Take 1,000 mcg by mouth every other day. Three times a week     No current facility-administered medications on file prior to visit.  ALLERGIES: Allergies  Allergen Reactions  . Shellfish Allergy Anaphylaxis  . Fluzone [Flu Virus Vaccine]     Increased MS  . Nitrofuran Derivatives Diarrhea  . Sulfa Antibiotics Hives    FAMILY HISTORY: Family History  Problem Relation Age of Onset  . Breast cancer Mother   . Hypertension Father   . Heart disease Father   . Hyperlipidemia Father   . CAD Father   . CAD Brother     SOCIAL HISTORY: Social History   Socioeconomic History  . Marital status: Married    Spouse name: Not on file  . Number of children: Not on file  . Years of education: Not on file  . Highest education level: Not on file  Social Needs  . Financial resource strain: Not on file  . Food insecurity - worry: Not on file  . Food insecurity - inability: Not on file  . Transportation needs - medical: Not on file  . Transportation needs - non-medical: Not on file  Occupational History  . Not on file  Tobacco Use  . Smoking status: Never Smoker  . Smokeless tobacco: Never Used  Substance and Sexual Activity  . Alcohol use: No  . Drug use: No  . Sexual activity: No    Partners: Male  Other Topics  Concern  . Not on file  Social History Narrative  . Not on file    REVIEW OF SYSTEMS: Constitutional: No fevers, chills, or sweats, no generalized fatigue, change in appetite Eyes: No visual changes, double vision, eye pain Ear, nose and throat: No hearing loss, ear pain, nasal congestion, sore throat Cardiovascular: No chest pain, palpitations Respiratory:  No shortness of breath at rest or with exertion, wheezes GastrointestinaI: No nausea, vomiting, diarrhea, abdominal pain, fecal incontinence Genitourinary:  No dysuria, urinary retention or frequency Musculoskeletal:  No neck pain, back pain Integumentary: No rash, pruritus, skin lesions Neurological: as above Psychiatric: No depression, insomnia, anxiety Endocrine: No palpitations, fatigue, diaphoresis, mood swings, change in appetite, change in weight, increased thirst Hematologic/Lymphatic:  No purpura, petechiae. Allergic/Immunologic: no itchy/runny eyes, nasal congestion, recent allergic reactions, rashes  PHYSICAL EXAM: Vitals:   07/24/17 1050  BP: 116/60  Pulse: 80  SpO2: 98%   General: No acute distress.  Patient appears well-groomed.  Head:  Normocephalic/atraumatic Eyes:  Fundi examined but not visualized Neck: supple, no paraspinal tenderness, full range of motion Heart:  Regular rate and rhythm Lungs:  Clear to auscultation bilaterally Back: No paraspinal tenderness Neurological Exam: alert and oriented to person, place, and time. Attention span and concentration intact, recent and remote memory intact, fund of knowledge intact.  Speech fluent and not dysarthric, language intact.  Able to complete Trail Making Test, and draw clock correctly.  Had some trouble copying a cube but was able to perform task correctly.  Able to copy intersecting pentagons.  Some trouble with serial 7 subtraction.  MMSE - Mini Mental State Exam 07/24/2017  Orientation to time 5  Orientation to Place 5  Registration 3  Attention/  Calculation 3  Recall 3  Language- name 2 objects 2  Language- repeat 1  Language- follow 3 step command 3  Language- read & follow direction 1  Write a sentence 1  Copy design 1  Total score 28   CN II-XII intact. No APD.  Bulk and tone normal, muscle strength 5/5 throughout.  Sensation to pinprick intact, decreased vibration sensation in toes.  Deep tendon reflexes 2+ throughout.  Finger to nose and heel  to shin normal.  Gait with right limp.  Timed 25 foot walk 6.38 seconds (6.83 seconds last year).  Romberg negative  IMPRESSION: 1.  Relapsing remitting MS, stable 2.  Short term memory deficits.  No significant cognitive impairment appreciated today.  Had some trouble with serial 7 subtraction.  Given lack of urinary symptoms and memory deficits resolved, I don't think testing and treating a UTI is warranted.  However, I advised to contact Dr. Laurann Montana. 3.  Dizziness likely BPPV  PLAN: 1.  Continue Copaxone and D3 2.  Recommend routine exercise, Mediterranean diet 3.  Follow up in one year or as needed.  25 minutes spent face to face with patient, over 50% spent discussing management.  Metta Clines, DO  CC:  Lavone Orn, MD

## 2017-07-27 DIAGNOSIS — H2512 Age-related nuclear cataract, left eye: Secondary | ICD-10-CM | POA: Diagnosis not present

## 2017-08-07 DIAGNOSIS — N179 Acute kidney failure, unspecified: Secondary | ICD-10-CM | POA: Diagnosis not present

## 2017-08-07 DIAGNOSIS — Z1389 Encounter for screening for other disorder: Secondary | ICD-10-CM | POA: Diagnosis not present

## 2017-08-07 DIAGNOSIS — R8290 Unspecified abnormal findings in urine: Secondary | ICD-10-CM | POA: Diagnosis not present

## 2017-08-07 DIAGNOSIS — I10 Essential (primary) hypertension: Secondary | ICD-10-CM | POA: Diagnosis not present

## 2017-08-07 DIAGNOSIS — E7800 Pure hypercholesterolemia, unspecified: Secondary | ICD-10-CM | POA: Diagnosis not present

## 2017-08-07 DIAGNOSIS — Z7189 Other specified counseling: Secondary | ICD-10-CM | POA: Diagnosis not present

## 2017-08-07 DIAGNOSIS — Z0000 Encounter for general adult medical examination without abnormal findings: Secondary | ICD-10-CM | POA: Diagnosis not present

## 2017-08-07 DIAGNOSIS — E538 Deficiency of other specified B group vitamins: Secondary | ICD-10-CM | POA: Diagnosis not present

## 2017-08-07 DIAGNOSIS — Z23 Encounter for immunization: Secondary | ICD-10-CM | POA: Diagnosis not present

## 2017-08-07 DIAGNOSIS — G35 Multiple sclerosis: Secondary | ICD-10-CM | POA: Diagnosis not present

## 2017-08-12 ENCOUNTER — Other Ambulatory Visit: Payer: Self-pay | Admitting: Cardiology

## 2017-08-12 DIAGNOSIS — R351 Nocturia: Secondary | ICD-10-CM | POA: Diagnosis not present

## 2017-08-12 DIAGNOSIS — N3941 Urge incontinence: Secondary | ICD-10-CM | POA: Diagnosis not present

## 2017-08-12 NOTE — Progress Notes (Signed)
Cardiology Office Note:    Date:  08/13/2017   ID:  Kristina Donovan, [place] 1945-08-31, MRN [id number]  PCP:  Lavone Orn, MD  Cardiologist:  No primary care provider on file.    Referring MD: Lavone Orn, MD   Chief Complaint  Patient presents with  . Congestive Heart Failure  . Hypertension    History of Present Illness:    Kristina Donovan is a 72 y.o. female with a hx of HTN, chronic LBBB, dyslipidemia, carotid artery stenosis and chronic diastolic CHF.  He is here today for followup and is doing well.  He denies any chest pain or pressure, SOB, DOE, PND, orthopnea, LE edema, dizziness, palpitations or syncope. He is compliant with his meds and is tolerating meds with no SE.     Past Medical History:  Diagnosis Date  . B12 deficiency    w chronic gastritis  . Carotid artery stenosis 2008   1-39% stenosis by dopplers 07/2016  . Chronic diastolic CHF (congestive heart failure) ([place])   . Elbow fracture   . Fecal incontinence    Hx of this  . Fracture, ankle    right  . Fracture, foot    left  . Hyperlipidemia   . Hypertension   . IBS (irritable bowel syndrome)   . LBBB (left bundle branch block) 01/11/2014  . Lung nodule    right lower lung  . Mild mitral regurgitation   . Multiple sclerosis, relapsing-remitting ([place])   . Perennial allergic rhinitis   . Urinary, incontinence, stress female     Past Surgical History:  Procedure Laterality Date  . CARDIAC CATHETERIZATION  2004   Normal coronary arteries, chronic Exertional dyspnea, cardiolite 2008 normal  . CHOLECYSTECTOMY  07/2000  . SALPINGOOPHORECTOMY     unilateral  . VAGINAL HYSTERECTOMY     and enterocele repair    Current Medications: Current Meds  Medication Sig  . Ascorbic Acid (VITAMIN C) 100 MG tablet Take 100 mg by mouth daily.  Marland Kitchen aspirin EC 81 MG tablet Take 81 mg by mouth daily.  . calcium carbonate (OS-CAL) 600 MG TABS tablet Take 600 mg by mouth 2 (two) times daily with a meal.  .  Cholecalciferol (VITAMIN D-3) 1000 units CAPS Take 2 capsules by mouth daily.  Marland Kitchen COPAXONE 40 MG/ML SOSY INJECT ONE SYRINGE (40 MG) SUBCUTANEOUSLY THREE TIMES PER WEEK AT LEAST 48 HOURS APART. ALLOW SYRINGE TO WARM TO ROOM TEMPERATURE FOR 20 MIN  . KLOR-CON M20 20 MEQ tablet TAKE 1 TABLET BY MOUTH EVERY DAY  . lisinopril-hydrochlorothiazide (PRINZIDE,ZESTORETIC) 10-12.5 MG per tablet Take 1 tablet by mouth daily.  Marland Kitchen loperamide (IMODIUM A-D) 2 MG tablet Take 2 mg by mouth daily.  . pravastatin (PRAVACHOL) 40 MG tablet Take 40 mg by mouth daily.  Marland Kitchen pyridOXINE (VITAMIN B-6) 100 MG tablet Take 100 mg by mouth daily.  . tamsulosin (FLOMAX) 0.4 MG CAPS capsule Take 0.4 mg by mouth daily.  . vitamin B-12 (CYANOCOBALAMIN) 1000 MCG tablet Take 1,000 mcg by mouth every other day. Three times a week  . vitamin E 400 UNIT capsule Take 400 Units by mouth daily.  . zoledronic acid (RECLAST) 5 MG/100ML SOLN injection Inject 5 mg into the vein once.     Allergies:   Shellfish allergy; Fluzone [flu virus vaccine]; Nitrofuran derivatives; and Sulfa antibiotics   Social History   Socioeconomic History  . Marital status: Married    Spouse name: None  . Number of children: None  .  Years of education: None  . Highest education level: None  Social Needs  . Financial resource strain: None  . Food insecurity - worry: None  . Food insecurity - inability: None  . Transportation needs - medical: None  . Transportation needs - non-medical: None  Occupational History  . None  Tobacco Use  . Smoking status: Never Smoker  . Smokeless tobacco: Never Used  Substance and Sexual Activity  . Alcohol use: No  . Drug use: No  . Sexual activity: No    Partners: Male  Other Topics Concern  . None  Social History Narrative  . None     Family History: The patient's family history includes Breast cancer in her mother; CAD in her brother and father; Heart disease in her father; Hyperlipidemia in her father;  Hypertension in her father.  ROS:   Please see the history of present illness.    ROS  All other systems reviewed and negative.   EKGs/Labs/Other Studies Reviewed:    The following studies were reviewed today: none  EKG:  EKG is  ordered today.  The ekg ordered today demonstrates NSR with LBBB  Recent Labs: 08/29/2016: BUN 21; Creatinine, Ser 0.93; Potassium 3.9; Sodium 140   Recent Lipid Panel No results found for: CHOL, TRIG, HDL, CHOLHDL, VLDL, LDLCALC, LDLDIRECT  Physical Exam:    VS:  BP 122/68   Pulse 72   Ht 5' 7.5" (1.715 m)   Wt 176 lb 12.8 oz (80.2 kg)   SpO2 97%   BMI 27.28 kg/m     Wt Readings from Last 3 Encounters:  08/13/17 176 lb 12.8 oz (80.2 kg)  07/24/17 176 lb 6.4 oz (80 kg)  08/13/16 173 lb (78.5 kg)     GEN:  Well nourished, well developed in no acute distress HEENT: Normal NECK: No JVD; No carotid bruits LYMPHATICS: No lymphadenopathy CARDIAC: RRR, no murmurs, rubs, gallops RESPIRATORY:  Clear to auscultation without rales, wheezing or rhonchi  ABDOMEN: Soft, non-tender, non-distended MUSCULOSKELETAL:  No edema; No deformity  SKIN: Warm and dry NEUROLOGIC:  Alert and oriented x 3 PSYCHIATRIC:  Normal affect   ASSESSMENT:    1. Chronic diastolic CHF (congestive heart failure), NYHA class 1 ([place])   2. Essential hypertension, benign   3. LBBB (left bundle branch block)   4. Bilateral carotid artery stenosis    PLAN:    In order of problems listed above:  1.  Chronic diastolic CHF - she appears euvolemic on exam today and her weight has remained stable.  She is on HCTZ.   2.  HTN - her BP is well controlled on exam today.  She will continue on Lisinopril HCT 10-12.5mg  daily.  Creatinine was stable at 1.4 earlier this month.  She has hydrated and is getting it rechecked tomorrow.  3 . LBBB - normal echo 2015.    4.  Bilateral carotid artery stenosis 1-39% by dopplers 08/2016.  Repeat dopplers in 1 year.  Continue ASA abd statin.     Medication Adjustments/Labs and Tests Ordered: Current medicines are reviewed at length with the patient today.  Concerns regarding medicines are outlined above.  Orders Placed This Encounter  Procedures  . EKG 12-Lead   No orders of the defined types were placed in this encounter.   Signed, Fransico Him, MD  08/13/2017 8:40 AM    [place]

## 2017-08-13 ENCOUNTER — Encounter: Payer: Self-pay | Admitting: Cardiology

## 2017-08-13 ENCOUNTER — Ambulatory Visit (INDEPENDENT_AMBULATORY_CARE_PROVIDER_SITE_OTHER): Payer: Medicare Other | Admitting: Cardiology

## 2017-08-13 VITALS — BP 122/68 | HR 72 | Ht 67.5 in | Wt 176.8 lb

## 2017-08-13 DIAGNOSIS — I5032 Chronic diastolic (congestive) heart failure: Secondary | ICD-10-CM

## 2017-08-13 DIAGNOSIS — I6523 Occlusion and stenosis of bilateral carotid arteries: Secondary | ICD-10-CM | POA: Diagnosis not present

## 2017-08-13 DIAGNOSIS — I447 Left bundle-branch block, unspecified: Secondary | ICD-10-CM | POA: Diagnosis not present

## 2017-08-13 DIAGNOSIS — I10 Essential (primary) hypertension: Secondary | ICD-10-CM

## 2017-08-13 NOTE — Patient Instructions (Signed)
Medication Instructions:  Your physician recommends that you continue on your current medications as directed. Please refer to the Current Medication list given to you today.  If you need a refill on your cardiac medications, please contact your pharmacy first.  Labwork: None ordered   Testing/Procedures: None ordered   Follow-Up: Your physician wants you to follow-up in: 1 year with Dr. Radford Donovan. You will receive a reminder letter in the mail two months in advance. If you don't receive a letter, please call our office to schedule the follow-up appointment.  Any Other Special Instructions Will Be Listed Below (If Applicable).   Heart Failure Heart failure means your heart has trouble pumping blood. This makes it hard for your body to work well. Heart failure is usually a long-term (chronic) condition. You must take good care of yourself and follow your doctor's treatment plan. Follow these instructions at home:  Take your heart medicine as told by your doctor. ? Do not stop taking medicine unless your doctor tells you to. ? Do not skip any dose of medicine. ? Refill your medicines before they run out. ? Take other medicines only as told by your doctor or pharmacist.  Stay active if told by your doctor. The elderly and people with severe heart failure should talk with a doctor about physical activity.  Eat heart-healthy foods. Choose foods that are without trans fat and are low in saturated fat, cholesterol, and salt (sodium). This includes fresh or frozen fruits and vegetables, fish, lean meats, fat-free or low-fat dairy foods, whole grains, and high-fiber foods. Lentils and dried peas and beans (legumes) are also good choices.  Limit salt if told by your doctor.  Cook in a healthy way. Roast, grill, broil, bake, poach, steam, or stir-fry foods.  Limit fluids as told by your doctor.  Weigh yourself every morning. Do this after you pee (urinate) and before you eat breakfast. Write  down your weight to give to your doctor.  Take your blood pressure and write it down if your doctor tells you to.  Ask your doctor how to check your pulse. Check your pulse as told.  Lose weight if told by your doctor.  Stop smoking or chewing tobacco. Do not use gum or patches that help you quit without your doctor's approval.  Schedule and go to doctor visits as told.  Nonpregnant women should have no more than 1 drink a day. Men should have no more than 2 drinks a day. Talk to your doctor about drinking alcohol.  Stop illegal drug use.  Stay current with shots (immunizations).  Manage your health conditions as told by your doctor.  Learn to manage your stress.  Rest when you are tired.  If it is really hot outside: ? Avoid intense activities. ? Use air conditioning or fans, or get in a cooler place. ? Avoid caffeine and alcohol. ? Wear loose-fitting, lightweight, and light-colored clothing.  If it is really cold outside: ? Avoid intense activities. ? Layer your clothing. ? Wear mittens or gloves, a hat, and a scarf when going outside. ? Avoid alcohol.  Learn about heart failure and get support as needed.  Get help to maintain or improve your quality of life and your ability to care for yourself as needed. Contact a doctor if:  You gain weight quickly.  You are more short of breath than usual.  You cannot do your normal activities.  You tire easily.  You cough more than normal, especially with activity.  You have any or more puffiness (swelling) in areas such as your hands, feet, ankles, or belly (abdomen).  You cannot sleep because it is hard to breathe.  You feel like your heart is beating fast (palpitations).  You get dizzy or light-headed when you stand up. Get help right away if:  You have trouble breathing.  There is a change in mental status, such as becoming less alert or not being able to focus.  You have chest pain or discomfort.  You  faint. This information is not intended to replace advice given to you by your health care provider. Make sure you discuss any questions you have with your health care provider. Document Released: 04/15/2008 Document Revised: 12/13/2015 Document Reviewed: 08/23/2012 Elsevier Interactive Patient Education  2017 [employer].     If you need a refill on your cardiac medications before your next appointment, please call your pharmacy.

## 2017-08-14 DIAGNOSIS — N179 Acute kidney failure, unspecified: Secondary | ICD-10-CM | POA: Diagnosis not present

## 2017-08-24 ENCOUNTER — Ambulatory Visit
Admission: RE | Admit: 2017-08-24 | Discharge: 2017-08-24 | Disposition: A | Discharge status: Home / Self Care | Payer: Medicare Other | Source: Ambulatory Visit | Attending: Internal Medicine | Admitting: Internal Medicine

## 2017-08-24 ENCOUNTER — Encounter: Payer: Self-pay | Admitting: Radiology

## 2017-08-24 DIAGNOSIS — Z1231 Encounter for screening mammogram for malignant neoplasm of breast: Secondary | ICD-10-CM

## 2017-08-24 DIAGNOSIS — N179 Acute kidney failure, unspecified: Secondary | ICD-10-CM | POA: Diagnosis not present

## 2017-08-31 ENCOUNTER — Telehealth: Payer: Self-pay | Admitting: Cardiology

## 2017-08-31 NOTE — Telephone Encounter (Signed)
New message     Patient wants to know who diagnosed her with CHF, it is making her insurance premium go up. Please call

## 2017-08-31 NOTE — Telephone Encounter (Signed)
Spoke with patient. Patient stated she requested her medical records from [facility] and she does not see where she was diagnosed with CHF. Patient would like to know a date when she was diagnosed her with CHF because it's increasing her insurance premium. Patient stated she was never informed that she has CHF. Informed patient that I would forward to Dr. Radford Pax for review. She verbalized understanding and thanked me for the call.

## 2017-08-31 NOTE — Telephone Encounter (Signed)
She has had diastolic dysfunction since her echo in 2015 and on diuretics for h/o SOB - diastolic CHF

## 2017-08-31 NOTE — Telephone Encounter (Signed)
Left a message to call back.

## 2017-09-01 NOTE — Telephone Encounter (Signed)
Explained to patient per echo in 2015 she had had diastolic dysfunction and has been taking HCTZ for it. Patient demanding a copy of the echo. Informed patient that I would print a copy and leave at the front desk for pick up. Patient appreciative for the call

## 2017-09-10 ENCOUNTER — Other Ambulatory Visit: Payer: Self-pay | Admitting: Neurology

## 2017-09-17 ENCOUNTER — Ambulatory Visit (INDEPENDENT_AMBULATORY_CARE_PROVIDER_SITE_OTHER): Payer: Medicare Other | Admitting: Podiatry

## 2017-09-17 ENCOUNTER — Encounter: Payer: Self-pay | Admitting: Podiatry

## 2017-09-17 DIAGNOSIS — M79676 Pain in unspecified toe(s): Secondary | ICD-10-CM | POA: Diagnosis not present

## 2017-09-17 DIAGNOSIS — B351 Tinea unguium: Secondary | ICD-10-CM

## 2017-09-17 NOTE — Progress Notes (Signed)
She presents today chief complaint of painful elongated toenails to the hallux and lesser digits on the right foot.  Objective: Pulses remain palpable no open lesions or wounds.  Toenails are long thick yellow dystrophic onychomycotic 1 and 2 and 4 and 5 of the right foot.  Assessment: Pain in limb secondary to onychomycosis right foot.  Plan: Debrided toenails 1 through 5 of the right foot.

## 2017-09-22 DIAGNOSIS — L821 Other seborrheic keratosis: Secondary | ICD-10-CM | POA: Diagnosis not present

## 2017-09-22 DIAGNOSIS — R21 Rash and other nonspecific skin eruption: Secondary | ICD-10-CM | POA: Diagnosis not present

## 2017-09-22 DIAGNOSIS — Z23 Encounter for immunization: Secondary | ICD-10-CM | POA: Diagnosis not present

## 2017-09-22 DIAGNOSIS — D225 Melanocytic nevi of trunk: Secondary | ICD-10-CM | POA: Diagnosis not present

## 2017-09-22 DIAGNOSIS — D2272 Melanocytic nevi of left lower limb, including hip: Secondary | ICD-10-CM | POA: Diagnosis not present

## 2017-09-22 DIAGNOSIS — D485 Neoplasm of uncertain behavior of skin: Secondary | ICD-10-CM | POA: Diagnosis not present

## 2017-09-22 DIAGNOSIS — L814 Other melanin hyperpigmentation: Secondary | ICD-10-CM | POA: Diagnosis not present

## 2017-09-22 DIAGNOSIS — L309 Dermatitis, unspecified: Secondary | ICD-10-CM | POA: Diagnosis not present

## 2017-09-22 DIAGNOSIS — D1801 Hemangioma of skin and subcutaneous tissue: Secondary | ICD-10-CM | POA: Diagnosis not present

## 2017-09-23 DIAGNOSIS — H2511 Age-related nuclear cataract, right eye: Secondary | ICD-10-CM | POA: Diagnosis not present

## 2017-09-28 DIAGNOSIS — H2181 Floppy iris syndrome: Secondary | ICD-10-CM | POA: Diagnosis not present

## 2017-09-28 DIAGNOSIS — H2511 Age-related nuclear cataract, right eye: Secondary | ICD-10-CM | POA: Diagnosis not present

## 2017-09-28 DIAGNOSIS — H21561 Pupillary abnormality, right eye: Secondary | ICD-10-CM | POA: Diagnosis not present

## 2017-09-29 DIAGNOSIS — I519 Heart disease, unspecified: Secondary | ICD-10-CM | POA: Diagnosis not present

## 2017-09-30 ENCOUNTER — Telehealth: Payer: Self-pay | Admitting: Cardiology

## 2017-09-30 NOTE — Telephone Encounter (Signed)
I spoke with Dr. Laurann Montana, Dr. Laurann Montana stated he would like to speak with Dr. Radford Pax concerning a diagnosis of CHF that is on patient's medical record. He states he has reviewed tests, labs, and medication and he doesn't see that patient is being treated for CHF, and if possible he would like to write a letter to help with cost of insurance for patient. Informed Dr. Laurann Montana that I would send to Dr. Radford Pax.

## 2017-09-30 NOTE — Telephone Encounter (Signed)
New Message    Dr Laurann Montana would like to speak to you about this patient , said it was ok to leave message

## 2017-10-01 ENCOUNTER — Encounter: Payer: Self-pay | Admitting: Cardiology

## 2017-10-01 DIAGNOSIS — I5189 Other ill-defined heart diseases: Secondary | ICD-10-CM | POA: Insufficient documentation

## 2017-10-01 NOTE — Progress Notes (Signed)
Letter printed and placed up front for pick. Patient stated she would come by tomorrow and very thankful for the call.

## 2017-10-01 NOTE — Telephone Encounter (Signed)
Discussed with Dr. Lavone Orn and it appears after review of office notes dating back to 2015 that a diagnosis of chronic diastolic CHF was made in error.  The patient has grade 1 diastolic dysfunction on 2D echo in the past but has never had CHF.  She is on diuretic therapy only for HTN treatment. I will remove this diagnosis from her chart.  I will also write a letter so she can submit it to her [employer].

## 2017-10-14 DIAGNOSIS — I10 Essential (primary) hypertension: Secondary | ICD-10-CM | POA: Diagnosis not present

## 2017-10-14 DIAGNOSIS — M810 Age-related osteoporosis without current pathological fracture: Secondary | ICD-10-CM | POA: Diagnosis not present

## 2017-10-14 DIAGNOSIS — D510 Vitamin B12 deficiency anemia due to intrinsic factor deficiency: Secondary | ICD-10-CM | POA: Diagnosis not present

## 2017-10-20 ENCOUNTER — Telehealth: Payer: Self-pay | Admitting: Cardiology

## 2017-10-20 NOTE — Telephone Encounter (Signed)
New message     Patient wants to know if you sent letter to Medicare correcting her file?

## 2017-10-20 NOTE — Telephone Encounter (Signed)
Spoke with pt and she states that she submitted Dr. Theodosia Blender letter with her application to the insurance company and they told her that in order to get the CHF off her record our office would have to submit something directly to Medicare.  Asked pt what exactly they are wanting [place] to submit because the information in the letter says she doesn't have CHF and it was an error.  Pt is going to reach out to her insurance agent and see if they can submit the letter or get the fax information on where letter needs to be sent.  Pt will call back if any issues.

## 2017-10-29 DIAGNOSIS — Z5181 Encounter for therapeutic drug level monitoring: Secondary | ICD-10-CM | POA: Diagnosis not present

## 2017-11-06 ENCOUNTER — Other Ambulatory Visit: Payer: Self-pay | Admitting: Cardiology

## 2017-11-13 ENCOUNTER — Encounter: Payer: Self-pay | Admitting: Neurology

## 2017-12-15 ENCOUNTER — Other Ambulatory Visit: Payer: Self-pay

## 2017-12-15 ENCOUNTER — Ambulatory Visit (INDEPENDENT_AMBULATORY_CARE_PROVIDER_SITE_OTHER): Payer: Medicare Other | Admitting: Podiatry

## 2017-12-15 ENCOUNTER — Encounter: Payer: Self-pay | Admitting: Podiatry

## 2017-12-15 DIAGNOSIS — B351 Tinea unguium: Secondary | ICD-10-CM

## 2017-12-15 DIAGNOSIS — M79676 Pain in unspecified toe(s): Secondary | ICD-10-CM | POA: Diagnosis not present

## 2017-12-15 NOTE — Progress Notes (Signed)
She presents today chief complaint of pain to the right foot.  States that these toenails are getting way too long and painful.  Objective evaluation reveals vital signs stable alert and oriented x3.  Pulses are palpable.  Neurologic sensorium is intact.  Deep tendon reflexes are intact she has pain on palpation of thick yellow dystrophic-like mycotic elongated nails right foot.  Assessment: Pain in limb secondary to onychomycosis right foot.  Plan: Toenails 1 through 5 right foot.

## 2018-02-03 ENCOUNTER — Other Ambulatory Visit: Payer: Self-pay | Admitting: Neurology

## 2018-02-08 DIAGNOSIS — I10 Essential (primary) hypertension: Secondary | ICD-10-CM | POA: Diagnosis not present

## 2018-02-08 DIAGNOSIS — N183 Chronic kidney disease, stage 3 (moderate): Secondary | ICD-10-CM | POA: Diagnosis not present

## 2018-02-08 DIAGNOSIS — N179 Acute kidney failure, unspecified: Secondary | ICD-10-CM | POA: Diagnosis not present

## 2018-02-08 DIAGNOSIS — R29898 Other symptoms and signs involving the musculoskeletal system: Secondary | ICD-10-CM | POA: Diagnosis not present

## 2018-02-08 DIAGNOSIS — M810 Age-related osteoporosis without current pathological fracture: Secondary | ICD-10-CM | POA: Diagnosis not present

## 2018-02-16 DIAGNOSIS — N179 Acute kidney failure, unspecified: Secondary | ICD-10-CM | POA: Diagnosis not present

## 2018-02-16 DIAGNOSIS — N183 Chronic kidney disease, stage 3 (moderate): Secondary | ICD-10-CM | POA: Diagnosis not present

## 2018-03-02 ENCOUNTER — Other Ambulatory Visit (HOSPITAL_COMMUNITY): Payer: Self-pay

## 2018-03-03 ENCOUNTER — Ambulatory Visit (HOSPITAL_COMMUNITY)
Admission: RE | Admit: 2018-03-03 | Discharge: 2018-03-03 | Disposition: A | Discharge status: Home / Self Care | Payer: Medicare Other | Source: Ambulatory Visit | Attending: Internal Medicine | Admitting: Internal Medicine

## 2018-03-03 DIAGNOSIS — M810 Age-related osteoporosis without current pathological fracture: Secondary | ICD-10-CM | POA: Insufficient documentation

## 2018-03-03 MED ORDER — ZOLEDRONIC ACID 5 MG/100ML IV SOLN
5.0000 mg | Freq: Once | INTRAVENOUS | Status: AC
  Administered 2018-03-03: 5 mg via INTRAVENOUS

## 2018-03-16 ENCOUNTER — Encounter: Payer: Self-pay | Admitting: Podiatry

## 2018-03-16 ENCOUNTER — Ambulatory Visit (INDEPENDENT_AMBULATORY_CARE_PROVIDER_SITE_OTHER): Payer: Medicare Other | Admitting: Podiatry

## 2018-03-16 DIAGNOSIS — B351 Tinea unguium: Secondary | ICD-10-CM

## 2018-03-16 DIAGNOSIS — M79676 Pain in unspecified toe(s): Secondary | ICD-10-CM

## 2018-03-16 NOTE — Progress Notes (Signed)
She presents today chief complaint of painful toenails 1 through 5 of the right foot.  Objective: Vital signs are stable she is alert and oriented x3 toenails are thick yellow dystrophic-like mycotic 1 through 5 of the right foot no open lesions or wounds.  Assessment: Pain in limb secondary to onychomycosis.  1 through 5.  Plan: Debridement of toenails 1 through 5 of the right foot.

## 2018-05-04 DIAGNOSIS — Z23 Encounter for immunization: Secondary | ICD-10-CM | POA: Diagnosis not present

## 2018-07-16 ENCOUNTER — Ambulatory Visit (INDEPENDENT_AMBULATORY_CARE_PROVIDER_SITE_OTHER): Payer: Medicare Other | Admitting: Podiatry

## 2018-07-16 DIAGNOSIS — K219 Gastro-esophageal reflux disease without esophagitis: Secondary | ICD-10-CM | POA: Insufficient documentation

## 2018-07-16 DIAGNOSIS — M899 Disorder of bone, unspecified: Secondary | ICD-10-CM | POA: Insufficient documentation

## 2018-07-16 DIAGNOSIS — K589 Irritable bowel syndrome without diarrhea: Secondary | ICD-10-CM | POA: Insufficient documentation

## 2018-07-16 DIAGNOSIS — R159 Full incontinence of feces: Secondary | ICD-10-CM | POA: Insufficient documentation

## 2018-07-16 DIAGNOSIS — E538 Deficiency of other specified B group vitamins: Secondary | ICD-10-CM | POA: Insufficient documentation

## 2018-07-16 DIAGNOSIS — M79676 Pain in unspecified toe(s): Secondary | ICD-10-CM | POA: Diagnosis not present

## 2018-07-16 DIAGNOSIS — B351 Tinea unguium: Secondary | ICD-10-CM

## 2018-07-16 DIAGNOSIS — J309 Allergic rhinitis, unspecified: Secondary | ICD-10-CM | POA: Insufficient documentation

## 2018-07-16 DIAGNOSIS — M949 Disorder of cartilage, unspecified: Secondary | ICD-10-CM

## 2018-07-16 DIAGNOSIS — I119 Hypertensive heart disease without heart failure: Secondary | ICD-10-CM | POA: Insufficient documentation

## 2018-07-16 NOTE — Patient Instructions (Signed)

## 2018-07-22 NOTE — Progress Notes (Deleted)
NEUROLOGY FOLLOW UP OFFICE NOTE  GABREILLE DARDIS [id number]  HISTORY OF PRESENT ILLNESS: Ameli Sangiovanni is a 73 year old right-handed woman with multiple sclerosis, hypertension, hyperlipidemia, mild carotid artery disease, LBBB, mild MR, IBS and diastolic dysfunction who follows up for multiple sclerosis.  UPDATE: Current disease modifying therapy: Copaxone Other medication: D3 2000 IU daily  Vision:  *** Motor:  *** Sensory:  *** Pain:  *** Gait:  *** Bowel/Bladder: Has increased urinary urgency and frequency.  On Flomax. Fatigue:  *** Cognition:  *** Mood:  ***  HISTORY: She first exhibited symptoms in 1984. At that time she developed left leg numbness. In 1985, she developed right leg numbness. She then exhibited lower extremity numbness in 1986 as well. No definitive diagnosis was ever made at that time. She was doing well until 1997. At that time she developed leg numbness again. She reportedly had imaging of the brain, likely MRI with contrast. No lumbar puncture was performed. It was determined that she likely had multiple sclerosis but she was not started on a disease modifying agent because her symptoms had completely resolved. In 2005, she had another flareup of left lower extremity numbness, this time associated with weakness. This followed a flu shot. She was not treated with IV steroids. She had an MRI of the brain with and without contrast performed 10/16/03 for right sided numbness and weakness. It revealed multiple, many ovoid-shaped, foci of hyperintensity in the bilateral periventricular white matter, including the callosal septal interface. No abnormal enhancement. MRA of the head was unremarkable. She was started on Betaseron. She experienced flulike symptoms, but otherwise she tolerated it well. Since she did not have any further flareups, except for some mild symptoms of numbness, Betaseron was discontinued in 2013, since she was clinically and  radiographically stable. In August 2015, she developed sudden onset of vertical diplopia. She denied any blurred vision or eye pain. She denied any lower extremity symptoms.   In November 2015, she had an episode where she lost consciousness, falling and hitting her face.  No seizure-like activity was witnessed.  She was not postictal.  EEG performed on 06/06/14 was normal.  CT of the head performed on 06/07/14 showed nothing acute.  She was evaluated by cardiology.  2D echo showed LVEF 55-60%.  She wore a heart monitor for a week, which was unremarkable.  She has not had any other episodes of syncope.  Etiology is unknown but it may have been dehydration.    03/23/14 MRI of the brain with and without contrast:  stable compared to prior study from 2005.  03/23/14 MRI of the cervical spine with and without contrast: non-enhancing plaques at C2 and C3 levels, as well as degenerative arthritic changes. 07/31/16 MRI brain and cervical spine with and without contrast: Unchanged from prior MRI on 03/23/2014 with no new or active lesions.   PAST MEDICAL HISTORY: Past Medical History:  Diagnosis Date  . B12 deficiency    w chronic gastritis  . Carotid artery stenosis 2008   1-39% stenosis by dopplers 07/2016  . Diastolic dysfunction    no history of CHF  . Elbow fracture   . Fecal incontinence    Hx of this  . Fracture, ankle    right  . Fracture, foot    left  . Hyperlipidemia   . Hypertension   . IBS (irritable bowel syndrome)   . LBBB (left bundle branch block) 01/11/2014  . Lung nodule    right lower lung  .  Mild mitral regurgitation   . Multiple sclerosis, relapsing-remitting ([place])   . Perennial allergic rhinitis   . Urinary, incontinence, stress female     MEDICATIONS: Current Outpatient Medications on File Prior to Visit  Medication Sig Dispense Refill  . Ascorbic Acid (VITAMIN C) 100 MG tablet Take 100 mg by mouth daily.    Marland Kitchen aspirin EC 81 MG tablet Take 81 mg by mouth daily.      . calcium carbonate (OS-CAL) 600 MG TABS tablet Take 600 mg by mouth 2 (two) times daily with a meal.    . Cholecalciferol (VITAMIN D-3) 1000 units CAPS Take 2 capsules by mouth daily.    Marland Kitchen COPAXONE 40 MG/ML SOSY INJECT ONE SYRINGE (40 MG) SUBCUTANEOUSLY THREE TIMES PER WEEK AT LEAST 48 HOURS APART. ALLOW SYRINGE TO WARM TO ROOM TEMPERATURE FOR 20 MIN 12 Syringe 6  . COPAXONE 40 MG/ML SOSY INJECT ONE SYRINGE SUBCUTANEOUSLY THREE TIMES PER WEEK AT LEAST 48 HOURS APART. ALLOW SYRINGE TO WARM TO ROOM TEMPERATURE FOR 20 MINUTES. RE 12 Syringe 5  . KLOR-CON M20 20 MEQ tablet TAKE 1 TABLET BY MOUTH EVERY DAY. MUST KEEP UPCOMING APPT FOR REFILLS 30 tablet 8  . lisinopril-hydrochlorothiazide (PRINZIDE,ZESTORETIC) 10-12.5 MG per tablet Take 1 tablet by mouth daily.    Marland Kitchen loperamide (IMODIUM A-D) 2 MG tablet Take 2 mg by mouth daily.    . pravastatin (PRAVACHOL) 40 MG tablet Take 40 mg by mouth daily.    Marland Kitchen pyridOXINE (VITAMIN B-6) 100 MG tablet Take 100 mg by mouth daily.    . tamsulosin (FLOMAX) 0.4 MG CAPS capsule Take 0.4 mg by mouth daily.    Marland Kitchen triamcinolone cream (KENALOG) 0.1 %     . vitamin B-12 (CYANOCOBALAMIN) 1000 MCG tablet Take 1,000 mcg by mouth every other day. Three times a week    . vitamin E 400 UNIT capsule Take 400 Units by mouth daily.    . zoledronic acid (RECLAST) 5 MG/100ML SOLN injection Inject 5 mg into the vein once.     No current facility-administered medications on file prior to visit.     ALLERGIES: Allergies  Allergen Reactions  . Shellfish Allergy Anaphylaxis  . Fluzone [Flu Virus Vaccine]     Increased MS  . Nitrofuran Derivatives Diarrhea  . Sulfa Antibiotics Hives    FAMILY HISTORY: Family History  Problem Relation Age of Onset  . Breast cancer Mother   . Hypertension Father   . Heart disease Father   . Hyperlipidemia Father   . CAD Father   . CAD Brother    ***.  SOCIAL HISTORY: Social History   Socioeconomic History  . Marital status: Married     Spouse name: Not on file  . Number of children: Not on file  . Years of education: Not on file  . Highest education level: Not on file  Occupational History  . Not on file  Social Needs  . Financial resource strain: Not on file  . Food insecurity:    Worry: Not on file    Inability: Not on file  . Transportation needs:    Medical: Not on file    Non-medical: Not on file  Tobacco Use  . Smoking status: Never Smoker  . Smokeless tobacco: Never Used  Substance and Sexual Activity  . Alcohol use: No  . Drug use: No  . Sexual activity: Never    Partners: Male  Lifestyle  . Physical activity:    Days per week: Not on file  Minutes per session: Not on file  . Stress: Not on file  Relationships  . Social connections:    Talks on phone: Not on file    Gets together: Not on file    Attends religious service: Not on file    Active member of club or organization: Not on file    Attends meetings of clubs or organizations: Not on file    Relationship status: Not on file  . Intimate partner violence:    Fear of current or ex partner: Not on file    Emotionally abused: Not on file    Physically abused: Not on file    Forced sexual activity: Not on file  Other Topics Concern  . Not on file  Social History Narrative  . Not on file    REVIEW OF SYSTEMS: Constitutional: No fevers, chills, or sweats, no generalized fatigue, change in appetite Eyes: No visual changes, double vision, eye pain Ear, nose and throat: No hearing loss, ear pain, nasal congestion, sore throat Cardiovascular: No chest pain, palpitations Respiratory:  No shortness of breath at rest or with exertion, wheezes GastrointestinaI: No nausea, vomiting, diarrhea, abdominal pain, fecal incontinence Genitourinary:  No dysuria, urinary retention or frequency Musculoskeletal:  No neck pain, back pain Integumentary: No rash, pruritus, skin lesions Neurological: as above Psychiatric: No depression, insomnia,  anxiety Endocrine: No palpitations, fatigue, diaphoresis, mood swings, change in appetite, change in weight, increased thirst Hematologic/Lymphatic:  No purpura, petechiae. Allergic/Immunologic: no itchy/runny eyes, nasal congestion, recent allergic reactions, rashes  PHYSICAL EXAM: *** General: No acute distress.  Patient appears ***-groomed.  *** body habitus. Head:  Normocephalic/atraumatic Eyes:  Fundi examined but not visualized Neck: supple, no paraspinal tenderness, full range of motion Heart:  Regular rate and rhythm Lungs:  Clear to auscultation bilaterally Back: No paraspinal tenderness Neurological Exam: alert and oriented to person, place, and time. Attention span and concentration intact, recent and remote memory intact, fund of knowledge intact.  Speech fluent and not dysarthric, language intact.  CN II-XII intact. Bulk and tone normal, muscle strength 5/5 throughout.  Sensation to light touch, temperature and vibration intact.  Deep tendon reflexes 2+ throughout, toes downgoing.  Finger to nose and heel to shin testing intact.  Gait normal, Romberg negative.  IMPRESSION: ***  PLAN: ***  Metta Clines, DO  CC: ***

## 2018-07-26 ENCOUNTER — Ambulatory Visit: Payer: Medicare Other | Admitting: Neurology

## 2018-07-28 ENCOUNTER — Other Ambulatory Visit: Payer: Self-pay | Admitting: Internal Medicine

## 2018-07-28 DIAGNOSIS — Z1231 Encounter for screening mammogram for malignant neoplasm of breast: Secondary | ICD-10-CM

## 2018-08-10 NOTE — Progress Notes (Signed)
NEUROLOGY FOLLOW UP OFFICE NOTE  Kristina Donovan [id number]  HISTORY OF PRESENT ILLNESS: Kristina Donovan is a 73 year old right-handed woman with multiple sclerosis, hypertension, hyperlipidemia, mild carotid artery disease, LBBB, mild MR, IBS and diastolic dysfunction who follows up for multiple sclerosis.  UPDATE: Current disease modifying therapy: Copaxone Other medication: D3 2000 IU daily  Vision:  No issues Motor:  No issues Sensory:  No issues Pain:  No issues Gait:  No issues Bowel/Bladder: Has increased urinary urgency and frequency.  On Flomax. Fatigue:  No issues Cognition:  No issues Mood:  No issues  She would like to stop her Copaxone.    HISTORY: She first exhibited symptoms in 1984. At that time she developed left leg numbness. In 1985, she developed right leg numbness. She then exhibited lower extremity numbness in 1986 as well. No definitive diagnosis was ever made at that time. She was doing well until 1997. At that time she developed leg numbness again. She reportedly had imaging of the brain, likely MRI with contrast. No lumbar puncture was performed. It was determined that she likely had multiple sclerosis but she was not started on a disease modifying agent because her symptoms had completely resolved. In 2005, she had another flareup of left lower extremity numbness, this time associated with weakness. This followed a flu shot. She was not treated with IV steroids. She had an MRI of the brain with and without contrast performed 10/16/03 for right sided numbness and weakness. It revealed multiple, many ovoid-shaped, foci of hyperintensity in the bilateral periventricular white matter, including the callosal septal interface. No abnormal enhancement. MRA of the head was unremarkable. She was started on Betaseron. She experienced flulike symptoms, but otherwise she tolerated it well. Since she did not have any further flareups, except for some mild symptoms  of numbness, Betaseron was discontinued in 2013, since she was clinically and radiographically stable. In August 2015, she developed sudden onset of vertical diplopia. She denied any blurred vision or eye pain. She denied any lower extremity symptoms.   In November 2015, she had an episode where she lost consciousness, falling and hitting her face.  No seizure-like activity was witnessed.  She was not postictal.  EEG performed on 06/06/14 was normal.  CT of the head performed on 06/07/14 showed nothing acute.  She was evaluated by cardiology.  2D echo showed LVEF 55-60%.  She wore a heart monitor for a week, which was unremarkable.  She has not had any other episodes of syncope.  Etiology is unknown but it may have been dehydration.    03/23/14 MRI of the brain with and without contrast:  stable compared to prior study from 2005.  03/23/14 MRI of the cervical spine with and without contrast: non-enhancing plaques at C2 and C3 levels, as well as degenerative arthritic changes. 07/31/16 MRI brain and cervical spine with and without contrast: Unchanged from prior MRI on 03/23/2014 with no new or active lesions.   PAST MEDICAL HISTORY: Past Medical History:  Diagnosis Date  . B12 deficiency    w chronic gastritis  . Carotid artery stenosis 2008   1-39% stenosis by dopplers 07/2016  . Diastolic dysfunction    no history of CHF  . Elbow fracture   . Fecal incontinence    Hx of this  . Fracture, ankle    right  . Fracture, foot    left  . Hyperlipidemia   . Hypertension   . IBS (irritable bowel syndrome)   .  LBBB (left bundle branch block) 01/11/2014  . Lung nodule    right lower lung  . Mild mitral regurgitation   . Multiple sclerosis, relapsing-remitting ([place])   . Perennial allergic rhinitis   . Urinary, incontinence, stress female     MEDICATIONS: Current Outpatient Medications on File Prior to Visit  Medication Sig Dispense Refill  . Ascorbic Acid (VITAMIN C) 100 MG tablet Take 100 mg  by mouth daily.    Marland Kitchen aspirin EC 81 MG tablet Take 81 mg by mouth daily.    . calcium carbonate (OS-CAL) 600 MG TABS tablet Take 600 mg by mouth 2 (two) times daily with a meal.    . Cholecalciferol (VITAMIN D-3) 1000 units CAPS Take 2 capsules by mouth daily.    Marland Kitchen COPAXONE 40 MG/ML SOSY INJECT ONE SYRINGE (40 MG) SUBCUTANEOUSLY THREE TIMES PER WEEK AT LEAST 48 HOURS APART. ALLOW SYRINGE TO WARM TO ROOM TEMPERATURE FOR 20 MIN 12 Syringe 6  . COPAXONE 40 MG/ML SOSY INJECT ONE SYRINGE SUBCUTANEOUSLY THREE TIMES PER WEEK AT LEAST 48 HOURS APART. ALLOW SYRINGE TO WARM TO ROOM TEMPERATURE FOR 20 MINUTES. RE 12 Syringe 5  . KLOR-CON M20 20 MEQ tablet TAKE 1 TABLET BY MOUTH EVERY DAY. MUST KEEP UPCOMING APPT FOR REFILLS 30 tablet 8  . lisinopril-hydrochlorothiazide (PRINZIDE,ZESTORETIC) 10-12.5 MG per tablet Take 1 tablet by mouth daily.    Marland Kitchen loperamide (IMODIUM A-D) 2 MG tablet Take 2 mg by mouth daily.    . pravastatin (PRAVACHOL) 40 MG tablet Take 40 mg by mouth daily.    Marland Kitchen pyridOXINE (VITAMIN B-6) 100 MG tablet Take 100 mg by mouth daily.    . tamsulosin (FLOMAX) 0.4 MG CAPS capsule Take 0.4 mg by mouth daily.    Marland Kitchen triamcinolone cream (KENALOG) 0.1 %     . vitamin B-12 (CYANOCOBALAMIN) 1000 MCG tablet Take 1,000 mcg by mouth every other day. Three times a week    . vitamin E 400 UNIT capsule Take 400 Units by mouth daily.    . zoledronic acid (RECLAST) 5 MG/100ML SOLN injection Inject 5 mg into the vein once.     No current facility-administered medications on file prior to visit.     ALLERGIES: Allergies  Allergen Reactions  . Shellfish Allergy Anaphylaxis  . Fluzone [Flu Virus Vaccine]     Increased MS  . Nitrofuran Derivatives Diarrhea  . Sulfa Antibiotics Hives    FAMILY HISTORY: Family History  Problem Relation Age of Onset  . Breast cancer Mother   . Hypertension Father   . Heart disease Father   . Hyperlipidemia Father   . CAD Father   . CAD Brother     SOCIAL  HISTORY: Social History   Socioeconomic History  . Marital status: Married    Spouse name: Not on file  . Number of children: Not on file  . Years of education: Not on file  . Highest education level: Not on file  Occupational History  . Not on file  Social Needs  . Financial resource strain: Not on file  . Food insecurity:    Worry: Not on file    Inability: Not on file  . Transportation needs:    Medical: Not on file    Non-medical: Not on file  Tobacco Use  . Smoking status: Never Smoker  . Smokeless tobacco: Never Used  Substance and Sexual Activity  . Alcohol use: No  . Drug use: No  . Sexual activity: Never    Partners:  Male  Lifestyle  . Physical activity:    Days per week: Not on file    Minutes per session: Not on file  . Stress: Not on file  Relationships  . Social connections:    Talks on phone: Not on file    Gets together: Not on file    Attends religious service: Not on file    Active member of club or organization: Not on file    Attends meetings of clubs or organizations: Not on file    Relationship status: Not on file  . Intimate partner violence:    Fear of current or ex partner: Not on file    Emotionally abused: Not on file    Physically abused: Not on file    Forced sexual activity: Not on file  Other Topics Concern  . Not on file  Social History Narrative  . Not on file    REVIEW OF SYSTEMS: Constitutional: No fevers, chills, or sweats, no generalized fatigue, change in appetite Eyes: No visual changes, double vision, eye pain Ear, nose and throat: No hearing loss, ear pain, nasal congestion, sore throat Cardiovascular: No chest pain, palpitations Respiratory:  No shortness of breath at rest or with exertion, wheezes GastrointestinaI: No nausea, vomiting, diarrhea, abdominal pain, fecal incontinence Genitourinary:  No dysuria, urinary retention or frequency Musculoskeletal:  No neck pain, back pain Integumentary: No rash, pruritus,  skin lesions Neurological: as above Psychiatric: No depression, insomnia, anxiety Endocrine: No palpitations, fatigue, diaphoresis, mood swings, change in appetite, change in weight, increased thirst Hematologic/Lymphatic:  No purpura, petechiae. Allergic/Immunologic: no itchy/runny eyes, nasal congestion, recent allergic reactions, rashes  PHYSICAL EXAM: Blood pressure 122/60, pulse 89, height 5' 7.5" (1.715 m), weight 173 lb (78.5 kg), SpO2 95 %. General: No acute distress.  Patient appears well-groomed. . Head:  Normocephalic/atraumatic Eyes:  Fundi examined but not visualized Neck: supple, no paraspinal tenderness, full range of motion Heart:  Regular rate and rhythm Lungs:  Clear to auscultation bilaterally Back: No paraspinal tenderness Neurological Exam: alert and oriented to person, place, and time. Attention span and concentration intact, recent and remote memory intact, fund of knowledge intact.  Speech fluent and not dysarthric, language intact.  CN II-XII intact. Bulk and tone normal, muscle strength 5/5 throughout.  Sensation to light touch, temperature and vibration intact.  Deep tendon reflexes 2+ throughout, toes downgoing.  Finger to nose and heel to shin testing intact.  Gait normal, Romberg negative.     IMPRESSION: Multiple sclerosis.  She wishes to stop Copaxone since she has been doing well.  We discussed possibility of relapse.  She would like to proceed discontinuation and monitoring.  PLAN: 1.  Continue to monitor off of Copaxone.  If she has any symptoms, she is to contact us. 2.  She is going to Dr. Delene Ruffini office for labs.  Advised to have vitamin D checked.  She was supposed to be on D3 2000 IU daily but her med list states 1000 IU.  She will verify correct dose.  Vitamin D level requested to be sent to me and further dose adjustment as needed (level should be above 50). 3.  Follow-up in 1 year or as needed  19 minutes spent face to face with patient, over 50%  spent discussing .  Metta Clines, DO  CC: Lavone Orn, MD

## 2018-08-11 ENCOUNTER — Other Ambulatory Visit: Payer: Self-pay | Admitting: Internal Medicine

## 2018-08-11 ENCOUNTER — Ambulatory Visit (INDEPENDENT_AMBULATORY_CARE_PROVIDER_SITE_OTHER): Payer: Medicare Other | Admitting: Neurology

## 2018-08-11 ENCOUNTER — Encounter: Payer: Self-pay | Admitting: Neurology

## 2018-08-11 VITALS — BP 122/60 | HR 89 | Ht 67.5 in | Wt 173.0 lb

## 2018-08-11 DIAGNOSIS — M810 Age-related osteoporosis without current pathological fracture: Secondary | ICD-10-CM | POA: Diagnosis not present

## 2018-08-11 DIAGNOSIS — G35 Multiple sclerosis: Secondary | ICD-10-CM | POA: Diagnosis not present

## 2018-08-11 DIAGNOSIS — N183 Chronic kidney disease, stage 3 (moderate): Secondary | ICD-10-CM | POA: Diagnosis not present

## 2018-08-11 DIAGNOSIS — I519 Heart disease, unspecified: Secondary | ICD-10-CM | POA: Diagnosis not present

## 2018-08-11 DIAGNOSIS — E7800 Pure hypercholesterolemia, unspecified: Secondary | ICD-10-CM | POA: Diagnosis not present

## 2018-08-11 DIAGNOSIS — D510 Vitamin B12 deficiency anemia due to intrinsic factor deficiency: Secondary | ICD-10-CM | POA: Diagnosis not present

## 2018-08-11 DIAGNOSIS — I10 Essential (primary) hypertension: Secondary | ICD-10-CM | POA: Diagnosis not present

## 2018-08-11 DIAGNOSIS — Z1389 Encounter for screening for other disorder: Secondary | ICD-10-CM | POA: Diagnosis not present

## 2018-08-11 DIAGNOSIS — Z0000 Encounter for general adult medical examination without abnormal findings: Secondary | ICD-10-CM | POA: Diagnosis not present

## 2018-08-11 NOTE — Patient Instructions (Signed)
1.  We will monitor off of Copaxone 2.  Continue vitamin D3.  I wanted you to take 2000 IU daily.  Contact me with the correct dose.  Have Dr. Laurann Montana check a vitamin D level and have him send it to my office for review 3.  Follow up in one year.

## 2018-08-14 ENCOUNTER — Encounter: Payer: Self-pay | Admitting: Podiatry

## 2018-08-14 NOTE — Progress Notes (Signed)
Subjective: Kristina Donovan presents today with painful, thick toenails 1-5 b/l that she cannot cut and which interfere with daily activities.  Pain is aggravated when wearing enclosed shoe gear.  Lavone Orn, MD with her PCP.   Current Outpatient Medications:  .  Ascorbic Acid (VITAMIN C) 100 MG tablet, Take 100 mg by mouth daily., Disp: , Rfl:  .  aspirin EC 81 MG tablet, Take 81 mg by mouth daily., Disp: , Rfl:  .  calcium carbonate (OS-CAL) 600 MG TABS tablet, Take 600 mg by mouth 2 (two) times daily with a meal., Disp: , Rfl:  .  Cholecalciferol (VITAMIN D-3) 1000 units CAPS, Take 2 capsules by mouth daily., Disp: , Rfl:  .  COPAXONE 40 MG/ML SOSY, INJECT ONE SYRINGE (40 MG) SUBCUTANEOUSLY THREE TIMES PER WEEK AT LEAST 48 HOURS APART. ALLOW SYRINGE TO WARM TO ROOM TEMPERATURE FOR 20 MIN, Disp: 12 Syringe, Rfl: 6 .  COPAXONE 40 MG/ML SOSY, INJECT ONE SYRINGE SUBCUTANEOUSLY THREE TIMES PER WEEK AT LEAST 48 HOURS APART. ALLOW SYRINGE TO WARM TO ROOM TEMPERATURE FOR 20 MINUTES. RE, Disp: 12 Syringe, Rfl: 5 .  KLOR-CON M20 20 MEQ tablet, TAKE 1 TABLET BY MOUTH EVERY DAY. MUST KEEP UPCOMING APPT FOR REFILLS, Disp: 30 tablet, Rfl: 8 .  lisinopril-hydrochlorothiazide (PRINZIDE,ZESTORETIC) 10-12.5 MG per tablet, Take 1 tablet by mouth daily., Disp: , Rfl:  .  loperamide (IMODIUM A-D) 2 MG tablet, Take 2 mg by mouth daily., Disp: , Rfl:  .  pravastatin (PRAVACHOL) 40 MG tablet, Take 40 mg by mouth daily., Disp: , Rfl:  .  pyridOXINE (VITAMIN B-6) 100 MG tablet, Take 100 mg by mouth daily., Disp: , Rfl:  .  tamsulosin (FLOMAX) 0.4 MG CAPS capsule, Take 0.4 mg by mouth daily., Disp: , Rfl:  .  triamcinolone cream (KENALOG) 0.1 %, , Disp: , Rfl:  .  vitamin B-12 (CYANOCOBALAMIN) 1000 MCG tablet, Take 1,000 mcg by mouth every other day. Three times a week, Disp: , Rfl:  .  vitamin E 400 UNIT capsule, Take 400 Units by mouth daily., Disp: , Rfl:  .  zoledronic acid (RECLAST) 5 MG/100ML SOLN  injection, Inject 5 mg into the vein once., Disp: , Rfl:   Allergies  Allergen Reactions  . Shellfish Allergy Anaphylaxis  . Fluzone [Flu Virus Vaccine]     Increased MS  . Nitrofuran Derivatives Diarrhea  . Sulfa Antibiotics Hives    Objective:  Vascular Examination: Capillary refill time immediate x 10 digits  Dorsalis pedis and Posterior tibial pulses palpable b/l  Digital hair present x 10 digits  Skin temperature gradient WNL b/l  Dermatological Examination: Skin with normal turgor, texture and tone b/l  Toenails 1-5 right foot discolored, thick, dystrophic with subungual debris and pain with palpation to nailbeds due to thickness of nails.  Musculoskeletal: Muscle strength 5/5 to all LE muscle groups  No gross bony deformities b/l.  No pain, crepitus or joint limitation noted with ROM.   Neurological: Sensation intact with 10 gram monofilament. Vibratory sensation intact.  Assessment: Painful onychomycosis toenails 1-5 right foot  Plan: 1. Toenails 1-5 right foot were debrided in length and girth without iatrogenic bleeding. 2. Patient to continue soft, supportive shoe gear 3. Patient to report any pedal injuries to medical professional immediately. 4. Follow up 3 months. Patient/POA to call should there be a concern in the interim.

## 2018-08-18 DIAGNOSIS — N319 Neuromuscular dysfunction of bladder, unspecified: Secondary | ICD-10-CM | POA: Diagnosis not present

## 2018-08-18 DIAGNOSIS — N3941 Urge incontinence: Secondary | ICD-10-CM | POA: Diagnosis not present

## 2018-08-19 DIAGNOSIS — Z79899 Other long term (current) drug therapy: Secondary | ICD-10-CM | POA: Diagnosis not present

## 2018-08-26 ENCOUNTER — Ambulatory Visit: Payer: Medicare Other

## 2018-09-02 ENCOUNTER — Ambulatory Visit
Admission: RE | Admit: 2018-09-02 | Discharge: 2018-09-02 | Disposition: A | Discharge status: Home / Self Care | Payer: Medicare Other | Source: Ambulatory Visit | Attending: Internal Medicine | Admitting: Internal Medicine

## 2018-09-02 DIAGNOSIS — Z1231 Encounter for screening mammogram for malignant neoplasm of breast: Secondary | ICD-10-CM

## 2018-09-20 DIAGNOSIS — Z79899 Other long term (current) drug therapy: Secondary | ICD-10-CM | POA: Diagnosis not present

## 2018-09-21 DIAGNOSIS — L821 Other seborrheic keratosis: Secondary | ICD-10-CM | POA: Diagnosis not present

## 2018-09-21 DIAGNOSIS — Z23 Encounter for immunization: Secondary | ICD-10-CM | POA: Diagnosis not present

## 2018-09-21 DIAGNOSIS — L814 Other melanin hyperpigmentation: Secondary | ICD-10-CM | POA: Diagnosis not present

## 2018-09-21 DIAGNOSIS — L570 Actinic keratosis: Secondary | ICD-10-CM | POA: Diagnosis not present

## 2018-09-21 DIAGNOSIS — D225 Melanocytic nevi of trunk: Secondary | ICD-10-CM | POA: Diagnosis not present

## 2018-10-06 ENCOUNTER — Other Ambulatory Visit: Payer: Medicare Other

## 2018-10-14 ENCOUNTER — Ambulatory Visit: Payer: Medicare Other | Admitting: Podiatry

## 2018-11-18 ENCOUNTER — Telehealth: Payer: Self-pay | Admitting: Cardiology

## 2018-11-18 NOTE — Telephone Encounter (Signed)
Call patient from recall list.  She stated she is doing fine and don't need to follow up with [place].  She is seeing her PC  MD.

## 2018-12-03 ENCOUNTER — Encounter: Payer: Self-pay | Admitting: Podiatry

## 2018-12-03 ENCOUNTER — Ambulatory Visit (INDEPENDENT_AMBULATORY_CARE_PROVIDER_SITE_OTHER): Payer: Medicare Other | Admitting: Podiatry

## 2018-12-03 ENCOUNTER — Other Ambulatory Visit: Payer: Self-pay

## 2018-12-03 VITALS — Temp 97.9°F

## 2018-12-03 DIAGNOSIS — M79676 Pain in unspecified toe(s): Secondary | ICD-10-CM | POA: Diagnosis not present

## 2018-12-03 DIAGNOSIS — B351 Tinea unguium: Secondary | ICD-10-CM

## 2018-12-03 NOTE — Patient Instructions (Signed)

## 2018-12-07 ENCOUNTER — Other Ambulatory Visit: Payer: Medicare Other

## 2018-12-13 NOTE — Progress Notes (Signed)
Subjective: Kristina Donovan presents today with painful, thick toenails 1-5 b/l that she cannot cut and which interfere with daily activities.  Pain is aggravated when wearing enclosed shoe gear.  Lavone Orn, MD is her PCP. Last visit was December 2019 or January 2020.   Current Outpatient Medications:  .  Ascorbic Acid (VITAMIN C) 100 MG tablet, Take 100 mg by mouth daily., Disp: , Rfl:  .  aspirin EC 81 MG tablet, Take 81 mg by mouth daily., Disp: , Rfl:  .  calcium carbonate (OS-CAL) 600 MG TABS tablet, Take 600 mg by mouth 2 (two) times daily with a meal., Disp: , Rfl:  .  Cholecalciferol (VITAMIN D-3) 1000 units CAPS, Take 2 capsules by mouth daily., Disp: , Rfl:  .  COPAXONE 40 MG/ML SOSY, INJECT ONE SYRINGE (40 MG) SUBCUTANEOUSLY THREE TIMES PER WEEK AT LEAST 48 HOURS APART. ALLOW SYRINGE TO WARM TO ROOM TEMPERATURE FOR 20 MIN, Disp: 12 Syringe, Rfl: 6 .  COPAXONE 40 MG/ML SOSY, INJECT ONE SYRINGE SUBCUTANEOUSLY THREE TIMES PER WEEK AT LEAST 48 HOURS APART. ALLOW SYRINGE TO WARM TO ROOM TEMPERATURE FOR 20 MINUTES. RE, Disp: 12 Syringe, Rfl: 5 .  KLOR-CON M20 20 MEQ tablet, TAKE 1 TABLET BY MOUTH EVERY DAY. MUST KEEP UPCOMING APPT FOR REFILLS, Disp: 30 tablet, Rfl: 8 .  lisinopril-hydrochlorothiazide (PRINZIDE,ZESTORETIC) 10-12.5 MG per tablet, Take 1 tablet by mouth daily., Disp: , Rfl:  .  loperamide (IMODIUM A-D) 2 MG tablet, Take 2 mg by mouth daily., Disp: , Rfl:  .  pravastatin (PRAVACHOL) 40 MG tablet, Take 40 mg by mouth daily., Disp: , Rfl:  .  pyridOXINE (VITAMIN B-6) 100 MG tablet, Take 100 mg by mouth daily., Disp: , Rfl:  .  tamsulosin (FLOMAX) 0.4 MG CAPS capsule, Take 0.4 mg by mouth daily., Disp: , Rfl:  .  triamcinolone cream (KENALOG) 0.1 %, , Disp: , Rfl:  .  vitamin B-12 (CYANOCOBALAMIN) 1000 MCG tablet, Take 1,000 mcg by mouth every other day. Three times a week, Disp: , Rfl:  .  vitamin E 400 UNIT capsule, Take 400 Units by mouth daily., Disp: , Rfl:  .   zoledronic acid (RECLAST) 5 MG/100ML SOLN injection, Inject 5 mg into the vein once., Disp: , Rfl:   Allergies  Allergen Reactions  . Shellfish Allergy Anaphylaxis  . Fluzone [Flu Virus Vaccine]     Increased MS  . Nitrofuran Derivatives Diarrhea  . Sulfa Antibiotics Hives    Objective: Vitals:   12/03/18 1201  Temp: 97.9 F (36.6 C)   Vascular Examination: Capillary refill time immediate x 10 digits.  Dorsalis pedis and Posterior tibial pulses palpable b/l.  Digital hair present x 10 digits.  Skin temperature gradient WNL b/l.  Dermatological Examination: Skin with normal turgor, texture and tone b/l.  Toenails 1-5 right discolored, thick, dystrophic with subungual debris and pain with palpation to nailbeds due to thickness of nails. Remaining 1-5 left foot nondystrophic.   Musculoskeletal: Muscle strength 5/5 to all LE muscle groups  No gross bony deformities b/l.  No pain, crepitus or joint limitation noted with ROM.   Neurological: Sensation intact with 10 gram monofilament.  Vibratory sensation intact.  Assessment: Painful onychomycosis toenails 1-5 right foot  Plan: 1. Toenails 1-5 right foot were debrided in length and girth. Iatrogenic laceration right 2nd digit addressed with Lumicain Hemostatic Solution. Cleansed with alcohol and antibiotic ointment applied. 2. Patient to continue soft, supportive shoe gear daily. 3. Patient to report any pedal  injuries to medical professional immediately. 4. Follow up 3 months.  5. Patient/POA to call should there be a concern in the interim.

## 2019-01-31 ENCOUNTER — Ambulatory Visit
Admission: RE | Admit: 2019-01-31 | Discharge: 2019-01-31 | Disposition: A | Discharge status: Home / Self Care | Payer: Medicare Other | Source: Ambulatory Visit | Attending: Internal Medicine | Admitting: Internal Medicine

## 2019-01-31 ENCOUNTER — Other Ambulatory Visit: Payer: Self-pay

## 2019-01-31 DIAGNOSIS — M810 Age-related osteoporosis without current pathological fracture: Secondary | ICD-10-CM

## 2019-01-31 DIAGNOSIS — M85852 Other specified disorders of bone density and structure, left thigh: Secondary | ICD-10-CM | POA: Diagnosis not present

## 2019-01-31 DIAGNOSIS — Z780 Asymptomatic menopausal state: Secondary | ICD-10-CM | POA: Diagnosis not present

## 2019-02-11 DIAGNOSIS — I10 Essential (primary) hypertension: Secondary | ICD-10-CM | POA: Diagnosis not present

## 2019-02-11 DIAGNOSIS — R066 Hiccough: Secondary | ICD-10-CM | POA: Diagnosis not present

## 2019-02-11 DIAGNOSIS — M810 Age-related osteoporosis without current pathological fracture: Secondary | ICD-10-CM | POA: Diagnosis not present

## 2019-02-23 ENCOUNTER — Encounter: Payer: Self-pay | Admitting: Neurology

## 2019-03-01 ENCOUNTER — Other Ambulatory Visit: Payer: Self-pay

## 2019-03-01 ENCOUNTER — Ambulatory Visit (INDEPENDENT_AMBULATORY_CARE_PROVIDER_SITE_OTHER): Payer: Medicare Other | Admitting: Podiatry

## 2019-03-01 ENCOUNTER — Encounter: Payer: Self-pay | Admitting: Podiatry

## 2019-03-01 VITALS — Temp 98.0°F

## 2019-03-01 DIAGNOSIS — M79676 Pain in unspecified toe(s): Secondary | ICD-10-CM

## 2019-03-01 DIAGNOSIS — B351 Tinea unguium: Secondary | ICD-10-CM | POA: Diagnosis not present

## 2019-03-01 NOTE — Patient Instructions (Signed)

## 2019-03-03 DIAGNOSIS — H04123 Dry eye syndrome of bilateral lacrimal glands: Secondary | ICD-10-CM | POA: Diagnosis not present

## 2019-03-03 DIAGNOSIS — Z961 Presence of intraocular lens: Secondary | ICD-10-CM | POA: Diagnosis not present

## 2019-03-04 ENCOUNTER — Ambulatory Visit: Payer: Medicare Other | Admitting: Podiatry

## 2019-03-10 NOTE — Progress Notes (Signed)
Subjective:  Kristina Donovan presents to clinic today with cc of  painful, thick, discolored, elongated toenails 1-5 right foot that become tender and cannot cut because of thickness.  Pain is aggravated when wearing enclosed shoe gear.  Lavone Orn, MD is her PCP.    Current Outpatient Medications:  .  Ascorbic Acid (VITAMIN C) 100 MG tablet, Take 100 mg by mouth daily., Disp: , Rfl:  .  aspirin EC 81 MG tablet, Take 81 mg by mouth daily., Disp: , Rfl:  .  calcium carbonate (OS-CAL) 600 MG TABS tablet, Take 600 mg by mouth 2 (two) times daily with a meal., Disp: , Rfl:  .  Cholecalciferol (VITAMIN D-3) 1000 units CAPS, Take 2 capsules by mouth daily., Disp: , Rfl:  .  COPAXONE 40 MG/ML SOSY, INJECT ONE SYRINGE (40 MG) SUBCUTANEOUSLY THREE TIMES PER WEEK AT LEAST 48 HOURS APART. ALLOW SYRINGE TO WARM TO ROOM TEMPERATURE FOR 20 MIN, Disp: 12 Syringe, Rfl: 6 .  COPAXONE 40 MG/ML SOSY, INJECT ONE SYRINGE SUBCUTANEOUSLY THREE TIMES PER WEEK AT LEAST 48 HOURS APART. ALLOW SYRINGE TO WARM TO ROOM TEMPERATURE FOR 20 MINUTES. RE, Disp: 12 Syringe, Rfl: 5 .  KLOR-CON M20 20 MEQ tablet, TAKE 1 TABLET BY MOUTH EVERY DAY. MUST KEEP UPCOMING APPT FOR REFILLS, Disp: 30 tablet, Rfl: 8 .  lisinopril-hydrochlorothiazide (PRINZIDE,ZESTORETIC) 10-12.5 MG per tablet, Take 1 tablet by mouth daily., Disp: , Rfl:  .  loperamide (IMODIUM A-D) 2 MG tablet, Take 2 mg by mouth daily., Disp: , Rfl:  .  pravastatin (PRAVACHOL) 40 MG tablet, Take 40 mg by mouth daily., Disp: , Rfl:  .  pyridOXINE (VITAMIN B-6) 100 MG tablet, Take 100 mg by mouth daily., Disp: , Rfl:  .  tamsulosin (FLOMAX) 0.4 MG CAPS capsule, Take 0.4 mg by mouth daily., Disp: , Rfl:  .  triamcinolone cream (KENALOG) 0.1 %, , Disp: , Rfl:  .  vitamin B-12 (CYANOCOBALAMIN) 1000 MCG tablet, Take 1,000 mcg by mouth every other day. Three times a week, Disp: , Rfl:  .  vitamin E 400 UNIT capsule, Take 400 Units by mouth daily., Disp: , Rfl:  .   zoledronic acid (RECLAST) 5 MG/100ML SOLN injection, Inject 5 mg into the vein once., Disp: , Rfl:    Allergies  Allergen Reactions  . Shellfish Allergy Anaphylaxis  . Fluzone [Flu Virus Vaccine]     Increased MS  . Nitrofuran Derivatives Diarrhea  . Sulfa Antibiotics Hives     Objective: Vitals:   03/01/19 1553  Temp: 98 F (36.7 C)    Physical Examination:  Vascular Examination: Capillary refill time immediate x 10 digits.  Palpable DP/PT pulses b/l.  Digital hair present b/l.  No edema noted b/l.  Skin temperature gradient WNL b/l.  Dermatological Examination: Skin with normal turgor, texture and tone b/l.  No open wounds b/l.  No interdigital macerations noted b/l.  Elongated, thick, discolored brittle toenails with subungual debris and pain on dorsal palpation of nailbeds 1-5 right foot.  Musculoskeletal Examination: Muscle strength 5/5 to all muscle groups b/l.  No pain, crepitus or joint discomfort with active/passive ROM.  Neurological Examination: Sensation intact 5/5 b/l with 10 gram monofilament.  Vibratory sensation intact b/l.  Proprioceptive sensation intact b/l.  Assessment: Mycotic nail infection with pain 1-5 right foot  Plan: 1. Toenails 1-5 right foot were debrided in length and girth without iatrogenic laceration. 2.  Continue soft, supportive shoe gear daily. 3.  Report any pedal injuries to  medical professional. 4.  Follow up 3 months. 5.  Patient/POA to call should there be a question/concern in there interim.

## 2019-03-26 DIAGNOSIS — Z23 Encounter for immunization: Secondary | ICD-10-CM | POA: Diagnosis not present

## 2019-06-03 ENCOUNTER — Encounter: Payer: Self-pay | Admitting: Podiatry

## 2019-06-03 ENCOUNTER — Ambulatory Visit (INDEPENDENT_AMBULATORY_CARE_PROVIDER_SITE_OTHER): Payer: Medicare Other | Admitting: Podiatry

## 2019-06-03 ENCOUNTER — Other Ambulatory Visit: Payer: Self-pay

## 2019-06-03 DIAGNOSIS — M79676 Pain in unspecified toe(s): Secondary | ICD-10-CM | POA: Diagnosis not present

## 2019-06-03 DIAGNOSIS — B351 Tinea unguium: Secondary | ICD-10-CM

## 2019-06-03 MED ORDER — NONFORMULARY OR COMPOUNDED ITEM: 3 refills | Status: AC

## 2019-06-03 NOTE — Patient Instructions (Signed)
[facility] [phone]   [email or web address]  Onychomycosis/Fungal Toenails  WHAT IS IT? An infection that lies within the keratin of your nail plate that is caused by a fungus.  WHY ME? Fungal infections affect all ages, sexes, races, and creeds.  There may be many factors that predispose you to a fungal infection such as age, coexisting medical conditions such as diabetes, or an autoimmune disease; stress, medications, fatigue, genetics, etc.  Bottom line: fungus thrives in a warm, moist environment and your shoes offer such a location.  IS IT CONTAGIOUS? Theoretically, yes.  You do not want to share shoes, nail clippers or files with someone who has fungal toenails.  Walking around barefoot in the same room or sleeping in the same bed is unlikely to transfer the organism.  It is important to realize, however, that fungus can spread easily from one nail to the next on the same foot.  HOW DO WE TREAT THIS?  There are several ways to treat this condition.  Treatment may depend on many factors such as age, medications, pregnancy, liver and kidney conditions, etc.  It is best to ask your doctor which options are available to you.  1. No treatment.   Unlike many other medical concerns, you can live with this condition.  However for many people this can be a painful condition and may lead to ingrown toenails or a bacterial infection.  It is recommended that you keep the nails cut short to help reduce the amount of fungal nail. 2. Topical treatment.  These range from herbal remedies to prescription strength nail lacquers.  About 40-50% effective, topicals require twice daily application for approximately 9 to 12 months or until an entirely new nail has grown out.  The most effective topicals are medical grade medications available through physicians offices. 3. Oral antifungal medications.  With an 80-90% cure rate, the most common oral medication requires 3 to 4 months of therapy and  stays in your system for a year as the new nail grows out.  Oral antifungal medications do require blood work to make sure it is a safe drug for you.  A liver function panel will be performed prior to starting the medication and after the first month of treatment.  It is important to have the blood work performed to avoid any harmful side effects.  In general, this medication safe but blood work is required. 4. Laser Therapy.  This treatment is performed by applying a specialized laser to the affected nail plate.  This therapy is noninvasive, fast, and non-painful.  It is not covered by insurance and is therefore, out of pocket.  The results have been very good with a 80-95% cure rate.  The [facility] is the only practice in the area to offer this therapy. 5. Permanent Nail Avulsion.  Removing the entire nail so that a new nail will not grow back. 

## 2019-06-07 DIAGNOSIS — D510 Vitamin B12 deficiency anemia due to intrinsic factor deficiency: Secondary | ICD-10-CM | POA: Diagnosis not present

## 2019-06-07 DIAGNOSIS — E7800 Pure hypercholesterolemia, unspecified: Secondary | ICD-10-CM | POA: Diagnosis not present

## 2019-06-07 DIAGNOSIS — M810 Age-related osteoporosis without current pathological fracture: Secondary | ICD-10-CM | POA: Diagnosis not present

## 2019-06-07 DIAGNOSIS — I10 Essential (primary) hypertension: Secondary | ICD-10-CM | POA: Diagnosis not present

## 2019-06-09 NOTE — Progress Notes (Signed)
Subjective: Kristina Donovan is seen today for follow up painful, elongated, thickened toenails 1-5 b/l feet that she cannot cut. Pain interferes with daily activities. Aggravating factor includes wearing enclosed shoe gear and relieved with periodic debridement.  She has been using Toecyclen Gel for her onychomycosis and feels it is not working.  Current Outpatient Medications on File Prior to Visit  Medication Sig  . Ascorbic Acid (VITAMIN C) 100 MG tablet Take 100 mg by mouth daily.  Marland Kitchen aspirin EC 81 MG tablet Take 81 mg by mouth daily.  . calcium carbonate (OS-CAL) 600 MG TABS tablet Take 600 mg by mouth 2 (two) times daily with a meal.  . Cholecalciferol (VITAMIN D-3) 1000 units CAPS Take 2 capsules by mouth daily.  Marland Kitchen COPAXONE 40 MG/ML SOSY INJECT ONE SYRINGE (40 MG) SUBCUTANEOUSLY THREE TIMES PER WEEK AT LEAST 48 HOURS APART. ALLOW SYRINGE TO WARM TO ROOM TEMPERATURE FOR 20 MIN  . COPAXONE 40 MG/ML SOSY INJECT ONE SYRINGE SUBCUTANEOUSLY THREE TIMES PER WEEK AT LEAST 48 HOURS APART. ALLOW SYRINGE TO WARM TO ROOM TEMPERATURE FOR 20 MINUTES. RE  . KLOR-CON M20 20 MEQ tablet TAKE 1 TABLET BY MOUTH EVERY DAY. MUST KEEP UPCOMING APPT FOR REFILLS  . lisinopril-hydrochlorothiazide (PRINZIDE,ZESTORETIC) 10-12.5 MG per tablet Take 1 tablet by mouth daily.  Marland Kitchen loperamide (IMODIUM A-D) 2 MG tablet Take 2 mg by mouth daily.  . pravastatin (PRAVACHOL) 40 MG tablet Take 40 mg by mouth daily.  Marland Kitchen pyridOXINE (VITAMIN B-6) 100 MG tablet Take 100 mg by mouth daily.  . tamsulosin (FLOMAX) 0.4 MG CAPS capsule Take 0.4 mg by mouth daily.  Marland Kitchen triamcinolone cream (KENALOG) 0.1 %   . vitamin B-12 (CYANOCOBALAMIN) 1000 MCG tablet Take 1,000 mcg by mouth every other day. Three times a week  . vitamin E 400 UNIT capsule Take 400 Units by mouth daily.  . zoledronic acid (RECLAST) 5 MG/100ML SOLN injection Inject 5 mg into the vein once.   No current facility-administered medications on file prior to visit.       Allergies  Allergen Reactions  . Shellfish Allergy Anaphylaxis  . Fluzone [Flu Virus Vaccine]     Increased MS  . Nitrofuran Derivatives Diarrhea  . Sulfa Antibiotics Hives     Objective:  Neurovascular Status intact b/l:  Capillary refill time immediate x 10 digits.  Dorsalis pedis present b/l.  Posterior tibial pulses present b/l.  Digital hair  present x 10 digits.  Skin temperature gradient WNL b/l.   Protective sensation intact with 10 gram monofilament bilaterally.  Dermatological Examination: Skin with normal turgor, texture and tone b/l.  Toenails 1-5 b/l discolored, thick, dystrophic with subungual debris and pain with palpation to nailbeds due to thickness of nails. No improvement in appearance of toenails.  Musculoskeletal: Muscle strength 5/5 to all LE muscle groups b/l.  No gross bony deformities b/l.  No pain, crepitus or joint limitation noted with ROM.   Assessment: Painful onychomycosis toenails 1-5 b/l   Plan: 1. Toenails 1-5 b/l were debrided in length and girth without iatrogenic bleeding. 2.  Discontinue Toecyclen Gel. Discussed topical, laser and oral medication. Patient opted for topical treatment with compounded medication. Rx written for nonformulary compounding topical antifungal: [facility]: Antifungal cream - Terbinafine 3%, Fluconazole 2%, Tea Tree Oil 5%, Urea 10%, Ibuprofen 2% in DMSO Suspension #61ml. Apply to the affected nail(s) at bedtime. 3. Patient to continue soft, supportive shoe gear. 4. Patient to report any pedal injuries to medical professional immediately. 5.  Follow up 3 months.  6. Patient/POA to call should there be a concern in the interim.

## 2019-07-05 ENCOUNTER — Telehealth: Payer: Self-pay | Admitting: Podiatry

## 2019-07-05 NOTE — Telephone Encounter (Signed)
Pt called to say that the topical solution that Dr. Elisha Ponder sent in to the [facility] did not receive the antifungal cream from 06/03/2019 visit pt had

## 2019-07-05 NOTE — Telephone Encounter (Signed)
May have answered pt question  About antifungal that Dr.Galaway sent to be filled

## 2019-07-12 ENCOUNTER — Telehealth: Payer: Self-pay | Admitting: Podiatry

## 2019-07-12 MED ORDER — NONFORMULARY OR COMPOUNDED ITEM: 11 refills | Status: AC

## 2019-07-12 NOTE — Telephone Encounter (Addendum)
I told pt that the rx had been sent to [facility] and I would call and send again. [place] and he states the compound department said they do not see the orders. I refaxed as a [facility] order.

## 2019-07-12 NOTE — Telephone Encounter (Signed)
Pt called about cream for fungus being sent to the  [facility] med still has not been sent

## 2019-07-12 NOTE — Addendum Note (Signed)
Addended by: Harriett Sine D on: 07/12/2019 03:50 PM   Modules accepted: Orders

## 2019-08-07 NOTE — Progress Notes (Addendum)
NEUROLOGY FOLLOW UP OFFICE NOTE  Jovani Entwistle [id number]  HISTORY OF PRESENT ILLNESS: Kristina Donovan is a 74 year old right-handed woman with multiple sclerosis, hypertension, hyperlipidemia, mild carotid artery disease, LBBB, mild MR, IBS and diastolic dysfunction who follows up for multiple sclerosis.  UPDATE: Current disease modifying therapy: none Other medication: D3 2000 IU daily, Prevagin  She discontinued Copaxone last year.  She has been feeling well. Vision:  No issues Motor:  No issues Sensory:  No issues Pain:  No issues Gait:  No issues Bowel/Bladder: Has increased urinary urgency and frequency.  On Flomax. Fatigue:  No issues Cognition:  Sometimes "forgetful".  Mood:  No issues   HISTORY: She first exhibited symptoms in 1984. At that time she developed left leg numbness. In 1985, she developed right leg numbness. She then exhibited lower extremity numbness in 1986 as well. No definitive diagnosis was ever made at that time. She was doing well until 1997. At that time she developed leg numbness again. She reportedly had imaging of the brain, likely MRI with contrast. No lumbar puncture was performed. It was determined that she likely had multiple sclerosis but she was not started on a disease modifying agent because her symptoms had completely resolved.In 2005, she had another flareup of left lower extremity numbness, this time associated with weakness. This followed a flu shot. She was not treated with IV steroids.She had an MRI of the brain with and without contrast performed 10/16/03 for right sided numbness and weakness.It revealed multiple, many ovoid-shaped, foci of hyperintensity in the bilateral periventricular white matter, including the callosal septal interface.No abnormal enhancement.MRA of the head was unremarkable.She was started on Betaseron. She experienced flulike symptoms, but otherwise she tolerated it well. Since she did not  have any further flareups, except for some mild symptoms of numbness, Betaseron was discontinued in 2013, since she was clinically and radiographically stable.In August 2015, she developed sudden onset of vertical diplopia. She denied any blurred vision or eye pain. She denied any lower extremity symptoms.   In November 2015, she had an episode where she lost consciousness, falling and hitting her face. No seizure-like activity was witnessed. She was not postictal. EEG performed on 06/06/14 was normal. CT of the head performed on 06/07/14 showed nothing acute. She was evaluated by cardiology. 2D echo showed LVEF 55-60%. She wore a heart monitor for a week, which was unremarkable. She has not had any other episodes of syncope. Etiology is unknown but it may have been dehydration.   03/23/14 MRI of the brain with and without contrast:  stable compared to prior study from 2005. 03/23/14 MRI of the cervical spine with and without contrast: non-enhancing plaques at C2 and C3 levels, as well as degenerative arthritic changes. 07/31/16 MRI brain and cervical spine with and without contrast: Unchanged from prior MRI on 03/23/2014 with no new or active lesions.  PAST MEDICAL HISTORY: Past Medical History:  Diagnosis Date  . B12 deficiency    w chronic gastritis  . Carotid artery stenosis 2008   1-39% stenosis by dopplers 07/2016  . Diastolic dysfunction    no history of CHF  . Elbow fracture   . Fecal incontinence    Hx of this  . Fracture, ankle    right  . Fracture, foot    left  . Hyperlipidemia   . Hypertension   . IBS (irritable bowel syndrome)   . LBBB (left bundle branch block) 01/11/2014  . Lung nodule    right lower  lung  . Mild mitral regurgitation   . Multiple sclerosis, relapsing-remitting ([place])   . Perennial allergic rhinitis   . Urinary, incontinence, stress female     MEDICATIONS: Outpatient Encounter Medications as of 08/09/2019  Medication Sig  . Ascorbic Acid  (VITAMIN C) 100 MG tablet Take 100 mg by mouth daily.  . calcium carbonate (OS-CAL) 600 MG TABS tablet Take 600 mg by mouth 2 (two) times daily with a meal.  . Cholecalciferol (VITAMIN D-3) 1000 units CAPS Take 2 capsules by mouth daily.  . lansoprazole (PREVACID) 30 MG capsule Take 30 mg by mouth daily at 12 noon.  Marland Kitchen lisinopril-hydrochlorothiazide (PRINZIDE,ZESTORETIC) 10-12.5 MG per tablet Take 1 tablet by mouth daily.  Marland Kitchen loperamide (IMODIUM A-D) 2 MG tablet Take 2 mg by mouth daily.  . NONFORMULARY OR COMPOUNDED ITEM Antifungal solution: Terbinafine 3%, Fluconazole 2%, Tea Tree Oil 5%, Urea 10%, Ibuprofen 2% in DMSO suspension #13mL  . NONFORMULARY OR COMPOUNDED ITEM [facility]:  Antifungal topical - Terbinafine 3%, Fluconazole 2%, Tea Tree Oil 5%, Urea 10%, 2% Ibuprofen in #18ml DMSO Suspension. Apply to affected toenail(s) once daily (at bedtime) or twice daily.  . pravastatin (PRAVACHOL) 40 MG tablet Take 40 mg by mouth daily.  Marland Kitchen pyridOXINE (VITAMIN B-6) 100 MG tablet Take 100 mg by mouth daily.  . tamsulosin (FLOMAX) 0.4 MG CAPS capsule Take 0.4 mg by mouth daily.  . vitamin B-12 (CYANOCOBALAMIN) 1000 MCG tablet Take 1,000 mcg by mouth every other day. Three times a week  . vitamin E 400 UNIT capsule Take 400 Units by mouth daily.  Marland Kitchen KLOR-CON M20 20 MEQ tablet TAKE 1 TABLET BY MOUTH EVERY DAY. MUST KEEP UPCOMING APPT FOR REFILLS (Patient not taking: Reported on 08/09/2019)  . triamcinolone cream (KENALOG) 0.1 %   . zoledronic acid (RECLAST) 5 MG/100ML SOLN injection Inject 5 mg into the vein once.  . [DISCONTINUED] aspirin EC 81 MG tablet Take 81 mg by mouth daily.  . [DISCONTINUED] COPAXONE 40 MG/ML SOSY INJECT ONE SYRINGE (40 MG) SUBCUTANEOUSLY THREE TIMES PER WEEK AT LEAST 48 HOURS APART. ALLOW SYRINGE TO WARM TO ROOM TEMPERATURE FOR 20 MIN  . [DISCONTINUED] COPAXONE 40 MG/ML SOSY INJECT ONE SYRINGE SUBCUTANEOUSLY THREE TIMES PER WEEK AT LEAST 48 HOURS APART. ALLOW SYRINGE TO WARM  TO ROOM TEMPERATURE FOR 20 MINUTES. RE   No facility-administered encounter medications on file as of 08/09/2019.    ALLERGIES: Allergies  Allergen Reactions  . Shellfish Allergy Anaphylaxis  . Fluzone [Flu Virus Vaccine]     Increased MS  . Nitrofuran Derivatives Diarrhea  . Sulfa Antibiotics Hives    FAMILY HISTORY: Family History  Problem Relation Age of Onset  . Breast cancer Mother   . Hypertension Father   . Heart disease Father   . Hyperlipidemia Father   . CAD Father   . CAD Brother     SOCIAL HISTORY: Social History   Socioeconomic History  . Marital status: Married    Spouse name: Not on file  . Number of children: Not on file  . Years of education: Not on file  . Highest education level: Not on file  Occupational History  . Not on file  Tobacco Use  . Smoking status: Never Smoker  . Smokeless tobacco: Never Used  Substance and Sexual Activity  . Alcohol use: No  . Drug use: No  . Sexual activity: Never    Partners: Male  Other Topics Concern  . Not on file  Social History Narrative  .  Not on file   Social Determinants of Health   Financial Resource Strain:   . Difficulty of Paying Living Expenses: Not on file  Food Insecurity:   . Worried About Charity fundraiser in the Last Year: Not on file  . Ran Out of Food in the Last Year: Not on file  Transportation Needs:   . Lack of Transportation (Medical): Not on file  . Lack of Transportation (Non-Medical): Not on file  Physical Activity:   . Days of Exercise per Week: Not on file  . Minutes of Exercise per Session: Not on file  Stress:   . Feeling of Stress : Not on file  Social Connections:   . Frequency of Communication with Friends and Family: Not on file  . Frequency of Social Gatherings with Friends and Family: Not on file  . Attends Religious Services: Not on file  . Active Member of Clubs or Organizations: Not on file  . Attends Archivist Meetings: Not on file  . Marital  Status: Not on file  Intimate Partner Violence:   . Fear of Current or Ex-Partner: Not on file  . Emotionally Abused: Not on file  . Physically Abused: Not on file  . Sexually Abused: Not on file    REVIEW OF SYSTEMS: Constitutional: No fevers, chills, or sweats, no generalized fatigue, change in appetite Eyes: No visual changes, double vision, eye pain Ear, nose and throat: No hearing loss, ear pain, nasal congestion, sore throat Cardiovascular: No chest pain, palpitations Respiratory:  No shortness of breath at rest or with exertion, wheezes GastrointestinaI: No nausea, vomiting, diarrhea, abdominal pain, fecal incontinence Genitourinary:  No dysuria, urinary retention or frequency Musculoskeletal:  No neck pain, back pain Integumentary: No rash, pruritus, skin lesions Neurological: as above Psychiatric: No depression, insomnia, anxiety Endocrine: No palpitations, fatigue, diaphoresis, mood swings, change in appetite, change in weight, increased thirst Hematologic/Lymphatic:  No purpura, petechiae. Allergic/Immunologic: no itchy/runny eyes, nasal congestion, recent allergic reactions, rashes  PHYSICAL EXAM: Blood pressure 125/61, pulse 72, temperature 97.7 F (36.5 C), height 5' 7.5" (1.715 m), weight 161 lb (73 kg), SpO2 99 %. General: No acute distress.  Patient appears well-groomed.   Head:  Normocephalic/atraumatic Eyes:  Fundi examined but not visualized Neck: supple, no paraspinal tenderness, full range of motion Heart:  Regular rate and rhythm Lungs:  Clear to auscultation bilaterally Back: No paraspinal tenderness Neurological Exam: alert and oriented to person, place, and time. Attention span and concentration intact, recent and remote memory intact, fund of knowledge intact.  Speech fluent and not dysarthric, language intact.  Surgical left pupil.  Otherwise, CN II-XII intact. Bulk and tone normal, muscle strength 5/5 throughout.  Sensation to light touch, temperature  and vibration intact.  Deep tendon reflexes 2+ throughout, toes downgoing.  Finger to nose and heel to shin testing intact.  Gait normal, Romberg negative.  IMPRESSION: Multiple sclerosis.  Off DMT.  Doing well.  Stable.  PLAN: D3 2000 IU daily Follow up in one year  Total time spent with patient:  15 minutes  Metta Clines, DO  CC: Lavone Orn, MD

## 2019-08-08 ENCOUNTER — Encounter: Payer: Self-pay | Admitting: Neurology

## 2019-08-09 ENCOUNTER — Encounter: Payer: Self-pay | Admitting: Neurology

## 2019-08-09 ENCOUNTER — Other Ambulatory Visit: Payer: Self-pay

## 2019-08-09 ENCOUNTER — Ambulatory Visit (INDEPENDENT_AMBULATORY_CARE_PROVIDER_SITE_OTHER): Payer: Medicare Other | Admitting: Neurology

## 2019-08-09 ENCOUNTER — Other Ambulatory Visit: Payer: Self-pay | Admitting: Internal Medicine

## 2019-08-09 VITALS — BP 125/61 | HR 72 | Temp 97.7°F | Ht 67.5 in | Wt 161.0 lb

## 2019-08-09 DIAGNOSIS — Z1231 Encounter for screening mammogram for malignant neoplasm of breast: Secondary | ICD-10-CM

## 2019-08-09 DIAGNOSIS — G35 Multiple sclerosis: Secondary | ICD-10-CM

## 2019-08-09 NOTE — Patient Instructions (Signed)
Continue vitamin D3 2000 IU daily Follow up in one year

## 2019-08-12 ENCOUNTER — Ambulatory Visit: Payer: Medicare Other | Attending: Internal Medicine

## 2019-08-12 ENCOUNTER — Ambulatory Visit: Payer: Medicare Other | Admitting: Neurology

## 2019-08-12 DIAGNOSIS — Z23 Encounter for immunization: Secondary | ICD-10-CM

## 2019-08-12 NOTE — Progress Notes (Signed)
   Covid-19 Vaccination Clinic  Name:  Kristina Donovan    MRN: [id number] DOB: 1945/10/24  08/12/2019  [name] was observed post Covid-19 immunization for 15 minutes without incidence. She was provided with Vaccine Information Sheet and instruction to access the V-Safe system.   [name] was instructed to call 911 with any severe reactions post vaccine: Marland Kitchen Difficulty breathing  . Swelling of your face and throat  . A fast heartbeat  . A bad rash all over your body  . Dizziness and weakness    Immunizations Administered    Name Date Dose VIS Date Route   Pfizer COVID-19 Vaccine 08/12/2019  9:50 AM 0.3 mL 07/01/2019 Intramuscular   Manufacturer: [employer]   Lot: [id number]   [place]: [id number]

## 2019-08-18 DIAGNOSIS — G35 Multiple sclerosis: Secondary | ICD-10-CM | POA: Diagnosis not present

## 2019-08-18 DIAGNOSIS — I10 Essential (primary) hypertension: Secondary | ICD-10-CM | POA: Diagnosis not present

## 2019-08-18 DIAGNOSIS — Z0000 Encounter for general adult medical examination without abnormal findings: Secondary | ICD-10-CM | POA: Diagnosis not present

## 2019-08-18 DIAGNOSIS — Z1389 Encounter for screening for other disorder: Secondary | ICD-10-CM | POA: Diagnosis not present

## 2019-08-18 DIAGNOSIS — M810 Age-related osteoporosis without current pathological fracture: Secondary | ICD-10-CM | POA: Diagnosis not present

## 2019-08-18 DIAGNOSIS — E7800 Pure hypercholesterolemia, unspecified: Secondary | ICD-10-CM | POA: Diagnosis not present

## 2019-08-19 DIAGNOSIS — I10 Essential (primary) hypertension: Secondary | ICD-10-CM | POA: Diagnosis not present

## 2019-08-19 DIAGNOSIS — E7800 Pure hypercholesterolemia, unspecified: Secondary | ICD-10-CM | POA: Diagnosis not present

## 2019-08-19 DIAGNOSIS — M810 Age-related osteoporosis without current pathological fracture: Secondary | ICD-10-CM | POA: Diagnosis not present

## 2019-08-19 DIAGNOSIS — D510 Vitamin B12 deficiency anemia due to intrinsic factor deficiency: Secondary | ICD-10-CM | POA: Diagnosis not present

## 2019-09-01 ENCOUNTER — Ambulatory Visit: Payer: Medicare Other | Attending: Internal Medicine

## 2019-09-01 DIAGNOSIS — Z23 Encounter for immunization: Secondary | ICD-10-CM | POA: Insufficient documentation

## 2019-09-01 NOTE — Progress Notes (Signed)
   Covid-19 Vaccination Clinic  Name:  Kristina Donovan    MRN: [id number] DOB: 1945-09-25  09/01/2019  [name] was observed post Covid-19 immunization for 15 minutes without incidence. She was provided with Vaccine Information Sheet and instruction to access the V-Safe system.   [name] was instructed to call 911 with any severe reactions post vaccine: Marland Kitchen Difficulty breathing  . Swelling of your face and throat  . A fast heartbeat  . A bad rash all over your body  . Dizziness and weakness    Immunizations Administered    Name Date Dose VIS Date Route   Pfizer COVID-19 Vaccine 09/01/2019  3:01 PM 0.3 mL 07/01/2019 Intramuscular   Manufacturer: [place]   Lot: [id number]   [place]: [id number]

## 2019-09-05 ENCOUNTER — Other Ambulatory Visit: Payer: Self-pay

## 2019-09-05 ENCOUNTER — Ambulatory Visit (INDEPENDENT_AMBULATORY_CARE_PROVIDER_SITE_OTHER): Payer: Self-pay | Admitting: Podiatry

## 2019-09-05 ENCOUNTER — Encounter: Payer: Self-pay | Admitting: Podiatry

## 2019-09-05 DIAGNOSIS — M79676 Pain in unspecified toe(s): Secondary | ICD-10-CM

## 2019-09-05 DIAGNOSIS — B351 Tinea unguium: Secondary | ICD-10-CM | POA: Diagnosis not present

## 2019-09-05 NOTE — Patient Instructions (Signed)

## 2019-09-05 NOTE — Progress Notes (Signed)
Subjective: Kristina Donovan presents today for follow up of painful mycotic nails b/l that are difficult to trim. Pain interferes with ambulation. Aggravating factors include wearing enclosed shoe gear. Pain is relieved with periodic professional debridement.   She states she has been using the compounded topical medication from [place] daily as instructed.  Allergies  Allergen Reactions  . Shellfish Allergy Anaphylaxis  . Fluzone [Flu Virus Vaccine]     Increased MS  . Nitrofuran Derivatives Diarrhea  . Sulfa Antibiotics Hives    Objective: There were no vitals filed for this visit.  Vascular Examination:  Capillary refill time to digits immediate b/l, palpable DP pulses b/l, palpable PT pulses b/l, pedal hair present b/l and skin temperature gradient within normal limits b/l  Dermatological Examination: Pedal skin with normal turgor, texture and tone bilaterally, no open wounds bilaterally, no interdigital macerations bilaterally and toenails 1-5 b/l elongated, dystrophic, thickened, crumbly with subungual debris  Musculoskeletal: Normal muscle strength 5/5 to all lower extremity muscle groups bilaterally, no gross bony deformities bilaterally and no pain crepitus or joint limitation noted with ROM b/l  Neurological: Protective sensation intact 5/5 intact bilaterally with 10g monofilament b/l and vibratory sensation intact b/l  Assessment: 1. Pain due to onychomycosis of toenail    Plan: -Toenails 1-5 b/l were debrided in length and girth without iatrogenic bleeding. Continue topical antifungal from [place] to toenails daily. -Patient to continue soft, supportive shoe gear daily. -Patient to report any pedal injuries to medical professional immediately. -Patient/POA to call should there be question/concern in the interim.  Return in about 3 months (around 12/03/2019) for nail trim.

## 2019-09-15 ENCOUNTER — Ambulatory Visit
Admission: RE | Admit: 2019-09-15 | Discharge: 2019-09-15 | Disposition: A | Discharge status: Home / Self Care | Payer: Medicare Other | Source: Ambulatory Visit | Attending: Internal Medicine | Admitting: Internal Medicine

## 2019-09-15 ENCOUNTER — Other Ambulatory Visit: Payer: Self-pay

## 2019-09-15 DIAGNOSIS — Z1231 Encounter for screening mammogram for malignant neoplasm of breast: Secondary | ICD-10-CM

## 2019-09-27 DIAGNOSIS — L578 Other skin changes due to chronic exposure to nonionizing radiation: Secondary | ICD-10-CM | POA: Diagnosis not present

## 2019-09-27 DIAGNOSIS — D225 Melanocytic nevi of trunk: Secondary | ICD-10-CM | POA: Diagnosis not present

## 2019-09-27 DIAGNOSIS — L814 Other melanin hyperpigmentation: Secondary | ICD-10-CM | POA: Diagnosis not present

## 2019-09-27 DIAGNOSIS — D485 Neoplasm of uncertain behavior of skin: Secondary | ICD-10-CM | POA: Diagnosis not present

## 2019-09-27 DIAGNOSIS — L720 Epidermal cyst: Secondary | ICD-10-CM | POA: Diagnosis not present

## 2019-09-27 DIAGNOSIS — L570 Actinic keratosis: Secondary | ICD-10-CM | POA: Diagnosis not present

## 2019-09-27 DIAGNOSIS — L820 Inflamed seborrheic keratosis: Secondary | ICD-10-CM | POA: Diagnosis not present

## 2019-09-27 DIAGNOSIS — Z23 Encounter for immunization: Secondary | ICD-10-CM | POA: Diagnosis not present

## 2019-09-27 DIAGNOSIS — L821 Other seborrheic keratosis: Secondary | ICD-10-CM | POA: Diagnosis not present

## 2019-12-05 ENCOUNTER — Encounter: Payer: Self-pay | Admitting: Podiatry

## 2019-12-05 ENCOUNTER — Ambulatory Visit (INDEPENDENT_AMBULATORY_CARE_PROVIDER_SITE_OTHER): Payer: Medicare Other | Admitting: Podiatry

## 2019-12-05 ENCOUNTER — Other Ambulatory Visit: Payer: Self-pay

## 2019-12-05 VITALS — Temp 97.3°F

## 2019-12-05 DIAGNOSIS — B351 Tinea unguium: Secondary | ICD-10-CM

## 2019-12-05 DIAGNOSIS — M79676 Pain in unspecified toe(s): Secondary | ICD-10-CM

## 2019-12-05 NOTE — Patient Instructions (Signed)

## 2019-12-11 NOTE — Progress Notes (Signed)
Subjective: Kristina Donovan is a 74 y.o. female patient seen today painful mycotic nails b/l that are difficult to trim. Pain interferes with ambulation. Aggravating factors include wearing enclosed shoe gear. Pain is relieved with periodic professional debridement.  She continues to use compounded topical antifungal from [employer].  She relates no new problems on today's visit.  Patient Active Problem List   Diagnosis Date Noted  . Acid reflux 07/16/2018  . 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 Fecal incontinence 07/16/2018  . Allergic rhinitis 07/16/2018  . B12 deficiency 07/16/2018  . Benign hypertensive heart disease without CHF 07/16/2018  . Bone/cartilage disorder 07/16/2018  . Diastolic dysfunction   . Carotid artery disease ([place]) 08/13/2016  . Right arm pain 06/23/2014  . Syncope and collapse 06/06/2014  . Crohn's disease ([place]) 06/06/2014  . Multiple sclerosis ([place]) 06/06/2014  . Family history of coronary artery disease in brother 06/06/2014  . PVD (peripheral vascular disease) ([place]) 06/06/2014  . Relapsing remitting multiple sclerosis ([place]) 03/13/2014  . Chronic diastolic CHF (congestive heart failure), NYHA class 1 ([place]) 01/11/2014  . LBBB (left bundle branch block) 01/11/2014  . Essential hypertension, benign 01/03/2014  . Hyperlipemia 01/03/2014    Current Outpatient Medications on File Prior to Visit  Medication Sig Dispense Refill  . Ascorbic Acid (VITAMIN C) 100 MG tablet Take 100 mg by mouth daily.    . calcium carbonate (OS-CAL) 600 MG TABS tablet Take 600 mg by mouth 2 (two) times daily with a meal.    . Cholecalciferol (VITAMIN D-3) 1000 units CAPS Take 2 capsules by mouth daily.    Marland Kitchen KLOR-CON M20 20 MEQ tablet TAKE 1 TABLET BY MOUTH EVERY DAY. MUST KEEP UPCOMING APPT FOR REFILLS (Patient not taking: Reported on 08/09/2019) 30 tablet 8  . lansoprazole (PREVACID) 30 MG capsule Take 30 mg by mouth daily at 12 noon.    Marland Kitchen lisinopril-hydrochlorothiazide  (PRINZIDE,ZESTORETIC) 10-12.5 MG per tablet Take 1 tablet by mouth daily.    Marland Kitchen loperamide (IMODIUM A-D) 2 MG tablet Take 2 mg by mouth daily.    . NONFORMULARY OR COMPOUNDED ITEM Antifungal solution: Terbinafine 3%, Fluconazole 2%, Tea Tree Oil 5%, Urea 10%, Ibuprofen 2% in DMSO suspension #6mL 1 each 3  . NONFORMULARY OR COMPOUNDED ITEM [facility]:  Antifungal topical - Terbinafine 3%, Fluconazole 2%, Tea Tree Oil 5%, Urea 10%, 2% Ibuprofen in #36ml DMSO Suspension. Apply to affected toenail(s) once daily (at bedtime) or twice daily. 30 each 11  . pravastatin (PRAVACHOL) 40 MG tablet Take 40 mg by mouth daily.    Marland Kitchen pyridOXINE (VITAMIN B-6) 100 MG tablet Take 100 mg by mouth daily.    . tamsulosin (FLOMAX) 0.4 MG CAPS capsule Take 0.4 mg by mouth daily.    Marland Kitchen triamcinolone cream (KENALOG) 0.1 %     . vitamin B-12 (CYANOCOBALAMIN) 1000 MCG tablet Take 1,000 mcg by mouth every other day. Three times a week    . vitamin E 400 UNIT capsule Take 400 Units by mouth daily.    . zoledronic acid (RECLAST) 5 MG/100ML SOLN injection Inject 5 mg into the vein once.     No current facility-administered medications on file prior to visit.    Allergies  Allergen Reactions  . Shellfish Allergy Anaphylaxis  . Fluzone [Flu Virus Vaccine]     Increased MS  . Nitrofuran Derivatives Diarrhea  . Sulfa Antibiotics Hives    Objective: Physical Exam  General: Kristina Donovan is a pleasant 74 year old Caucasian female, well-developed, well-nourished in no  acute distress.  Alert awake and oriented x3.  Neurovascular status unchanged b/l.  Capillary refill time to digits remained immediate bilaterally.  Dorsalis pedis and posterior tibial pulses remain palpable bilaterally.  Digital hair is noted to be present bilaterally.  Skin temperature gradient remains within normal limits bilaterally.  There is no edema noted bilaterally.  Negative pain with calf compression noted bilaterally.  Protective sensation is  noted to be intact 5/5 bilaterally with 10 g monofilament bilaterally.  Vibratory sensation remains intact bilaterally.  Dermatological examination: Her skin is noted to exhibit normal turgor turgor, texture, and tone bilaterally.  There are no open wounds noted bilaterally.  Toenails 1 through 5 bilaterally are noted to be elongated, dystrophic, discolored and thickened.  There also crumbly with debridement with subungual debris.  There is slight improvement in the proximal one third of the nail plates.  Musculoskeletal examination: Muscle strength is noted to be within normal limits 5/5 to all lower extremity muscle groups bilaterally.  There are no gross bony deformities noted bilaterally.  There is no pain, no crepitus or joint limitation with active or passive range of motion bilaterally.  Assessment: Painful mycotic toenails 1 through 5 bilaterally  Plan: 1.  Toenails 1 through 5 bilaterally were debrided in length and girth without complication with sterile nail nippers and dremel. 2.  She is to continue compounded topical antifungal solution from [place] to her toenails daily. 3.  She is to continue soft supportive shoe gear daily. 4.  Report any pedal injuries to medical professional immediately. 5.  Patient is to call should there be any questions or concerns in the interim.   Return in about 3 months (around 03/06/2020) for nail trim.  Marzetta Board, DPM

## 2020-01-24 ENCOUNTER — Telehealth: Payer: Self-pay | Admitting: Neurology

## 2020-01-24 NOTE — Telephone Encounter (Signed)
Patient called and left a message requesting a call back from a nurse. She said, "My family tells my I am having difficulty with cognitive thinking. I believe they are right and I'd like to know what I should do."

## 2020-01-24 NOTE — Telephone Encounter (Signed)
Spoke with daughter who called to give additional information re her mother. She knows that her mother called in earlier but wasn't sure if she conveyed how much she has declined cognitively since her last visit. Has concerns re pt safety driving etc. Wondering if this could be because of her stopping the Copaxone? The last 2-3 mo she has gotten significantly worse and wondering if there is anything that can be done prior to her next visit in Jan 2022 besides wait for an earlier appt? Told her I would tell Dr Tomi Likens her concerns and let her know.

## 2020-01-24 NOTE — Telephone Encounter (Signed)
Advised pt we will add her to the wait list.

## 2020-01-24 NOTE — Telephone Encounter (Signed)
Patient daughter called and wants to speak to someone about putting patient back on her MS medication. She states patient is having some cognitive issues now and they wonder if it is coming from being off the medication please call

## 2020-01-24 NOTE — Telephone Encounter (Signed)
She has a f/u 08/09/20, Should pt come in sooner, can wwe put her on the wait list?

## 2020-01-24 NOTE — Telephone Encounter (Signed)
I really would need to evaluate the patient to make further recommendations.  If her daughter has concerns about safety, then she probably shouldn't drive until further evaluation.  If patient would like to restart Copaxone, then we can restart it but that is up to the patient.

## 2020-01-24 NOTE — Telephone Encounter (Signed)
She should make a followup appointment

## 2020-01-24 NOTE — Telephone Encounter (Signed)
She was taking Copaxone 40mg  [place] three times a week

## 2020-01-24 NOTE — Telephone Encounter (Signed)
Pt advised to come to the office and sigh the paperwork to be sent off for Copaxone

## 2020-01-24 NOTE — Telephone Encounter (Signed)
Telephone call to pt, Pt states she had some problems with finishing her sentences. Pt states she family thinks she needs to come in sooner then her yearly visit in 07/2020.  Please advise on what pt should do.

## 2020-01-24 NOTE — Telephone Encounter (Signed)
Spoke to pt daughter advised of Dr. Tomi Likens note as well as pt was advised we will add her to the wait list.   Also advised pt daughter to have her mother contact us if she likes to restart  Copazone.

## 2020-01-24 NOTE — Telephone Encounter (Signed)
Patient called back in and said she does want to start back on Copaxone. She said she will need the paperwork to complete for a grant she had previously for the medication.

## 2020-01-24 NOTE — Telephone Encounter (Signed)
Yes

## 2020-01-25 NOTE — Telephone Encounter (Signed)
Pt stop by to sign paperwork. Paperwork faxed off.

## 2020-01-30 ENCOUNTER — Telehealth: Payer: Self-pay | Admitting: Neurology

## 2020-01-30 NOTE — Telephone Encounter (Signed)
Please advise 

## 2020-01-30 NOTE — Telephone Encounter (Signed)
Patient called in asking about her being able to take Copaxin and if there is a grant for her to be able to afford it.

## 2020-01-30 NOTE — Telephone Encounter (Signed)
I am not familiar about the grant, as she was on it prior to starting with me.  Has she contacted the company about it?

## 2020-01-31 ENCOUNTER — Encounter: Payer: Self-pay | Admitting: Neurology

## 2020-01-31 NOTE — Telephone Encounter (Signed)
I sent paperwork in for Copaxone 40mg  for this pt on 7/6. Is that the same thing.

## 2020-01-31 NOTE — Progress Notes (Addendum)
Johnny Hoctor KeyRosalio Loud - PA Case ID: [id number] - Rx #: [id number] Need help? Call us at [phone] Outcome Approvedtoday Your request has been approved Drug Copaxone 40MG /ML syringes Form [employer] Electronic [place] Form (2017 NCPDP) Original Claim Info 35  Fax received- approval valid from 11/02/19 to 01/30/21.

## 2020-01-31 NOTE — Telephone Encounter (Signed)
I think that was just a start form

## 2020-01-31 NOTE — Telephone Encounter (Signed)
Patient daughter and patient wants know who is taking care of  and where the paperwork for the Copaxone  Was sent please call patient daughter and she will get mother on a three way call

## 2020-01-31 NOTE — Telephone Encounter (Signed)
Advised pt daughter pt came by the office 01/24/20 and signed the paperwork for the grant and faxed it off.

## 2020-02-01 DIAGNOSIS — D510 Vitamin B12 deficiency anemia due to intrinsic factor deficiency: Secondary | ICD-10-CM | POA: Diagnosis not present

## 2020-02-01 DIAGNOSIS — M810 Age-related osteoporosis without current pathological fracture: Secondary | ICD-10-CM | POA: Diagnosis not present

## 2020-02-01 DIAGNOSIS — E7800 Pure hypercholesterolemia, unspecified: Secondary | ICD-10-CM | POA: Diagnosis not present

## 2020-02-01 DIAGNOSIS — N183 Chronic kidney disease, stage 3 unspecified: Secondary | ICD-10-CM | POA: Diagnosis not present

## 2020-02-01 DIAGNOSIS — I10 Essential (primary) hypertension: Secondary | ICD-10-CM | POA: Diagnosis not present

## 2020-02-06 ENCOUNTER — Telehealth: Payer: Self-pay | Admitting: Neurology

## 2020-02-06 NOTE — Telephone Encounter (Signed)
Patient left a voicemail checking on the status of her grant. States she hasn't heard back. She heard from [employer] that her medication (Copaxone) is ready but states she can't pay $1800 for it. Please call.

## 2020-02-06 NOTE — Telephone Encounter (Signed)
Any idea on this?

## 2020-02-06 NOTE — Telephone Encounter (Signed)
I called patient to give her update on grant paperwork status. Per recent documentation Vita Barley was working on this. I let the patient know that she was out of the office this week and that I would have her call her back with an update. Recent notes say that patient came in on 7/6 to sign form and that paperwork was faxed on that day. Awaiting response as far as I can see.   Sheena- please call and give patient update when you get back. Thanks!

## 2020-02-07 NOTE — Telephone Encounter (Signed)
Spoke with [facility] and they stated pt had a secondary insurance that was expired ([employer] Cop[employer]) which could potentially cover pts medication if reinstated. Spoke with pts daughter and gave her the phone [phone] to call and see about getting this in place for coverage. Will pass info on to [facility] w/ Dr Tomi Likens when she's back in the office for f/u, she verbalized understanding and will reach out to the referenced program.

## 2020-02-07 NOTE — Telephone Encounter (Signed)
Patient daughter wants to speak to the nurse that is handling Dr Tomi Likens patients and calls while Vita Barley is out. She wants to have information on the [place] paperwork  Please call

## 2020-02-08 NOTE — Telephone Encounter (Signed)
Telephone call to pt daughter, Pt unable to get the assistance due to her type of  insurance she has.  Advised pt daughter to ask her insurance company if they will cover the generic Glatiramer Acetate.  Per daughter the insuranceMusician) states they will cover and the pt should have to pay $ 300

## 2020-02-09 DIAGNOSIS — N319 Neuromuscular dysfunction of bladder, unspecified: Secondary | ICD-10-CM | POA: Diagnosis not present

## 2020-02-09 DIAGNOSIS — R351 Nocturia: Secondary | ICD-10-CM | POA: Diagnosis not present

## 2020-02-09 DIAGNOSIS — R350 Frequency of micturition: Secondary | ICD-10-CM | POA: Diagnosis not present

## 2020-02-12 ENCOUNTER — Emergency Department (HOSPITAL_COMMUNITY)
Admission: EM | Admit: 2020-02-12 | Discharge: 2020-02-12 | Disposition: A | Discharge status: Home / Self Care | Payer: Medicare Other | Attending: Emergency Medicine | Admitting: Emergency Medicine

## 2020-02-12 ENCOUNTER — Other Ambulatory Visit: Payer: Self-pay

## 2020-02-12 ENCOUNTER — Encounter (HOSPITAL_COMMUNITY): Payer: Self-pay

## 2020-02-12 ENCOUNTER — Emergency Department (HOSPITAL_COMMUNITY): Payer: Medicare Other

## 2020-02-12 DIAGNOSIS — I700 Atherosclerosis of aorta: Secondary | ICD-10-CM | POA: Diagnosis not present

## 2020-02-12 DIAGNOSIS — Z79899 Other long term (current) drug therapy: Secondary | ICD-10-CM | POA: Insufficient documentation

## 2020-02-12 DIAGNOSIS — Z9049 Acquired absence of other specified parts of digestive tract: Secondary | ICD-10-CM | POA: Diagnosis not present

## 2020-02-12 DIAGNOSIS — I5032 Chronic diastolic (congestive) heart failure: Secondary | ICD-10-CM | POA: Insufficient documentation

## 2020-02-12 DIAGNOSIS — N200 Calculus of kidney: Secondary | ICD-10-CM | POA: Diagnosis not present

## 2020-02-12 DIAGNOSIS — N1330 Unspecified hydronephrosis: Secondary | ICD-10-CM | POA: Diagnosis not present

## 2020-02-12 DIAGNOSIS — I110 Hypertensive heart disease with heart failure: Secondary | ICD-10-CM | POA: Insufficient documentation

## 2020-02-12 DIAGNOSIS — I2510 Atherosclerotic heart disease of native coronary artery without angina pectoris: Secondary | ICD-10-CM | POA: Diagnosis not present

## 2020-02-12 DIAGNOSIS — K449 Diaphragmatic hernia without obstruction or gangrene: Secondary | ICD-10-CM | POA: Diagnosis not present

## 2020-02-12 DIAGNOSIS — R109 Unspecified abdominal pain: Secondary | ICD-10-CM | POA: Diagnosis present

## 2020-02-12 LAB — URINALYSIS, ROUTINE W REFLEX MICROSCOPIC
Bilirubin Urine: NEGATIVE
Glucose, UA: NEGATIVE mg/dL
Ketones, ur: NEGATIVE mg/dL
Nitrite: NEGATIVE
Protein, ur: NEGATIVE mg/dL
RBC / HPF: >50 RBC/hpf — ABNORMAL HIGH (ref 0–5)
Specific Gravity, Urine: 1.018 (ref 1.005–1.030)
pH: 5 (ref 5.0–8.0)

## 2020-02-12 LAB — COMPREHENSIVE METABOLIC PANEL
ALT: 19 U/L (ref 0–44)
AST: 25 U/L (ref 15–41)
Albumin: 4.2 g/dL (ref 3.5–5.0)
Alkaline Phosphatase: 31 U/L — ABNORMAL LOW (ref 38–126)
Anion gap: 9 (ref 5–15)
BUN: 23 mg/dL (ref 8–23)
CO2: 23 mmol/L (ref 22–32)
Calcium: 9.9 mg/dL (ref 8.9–10.3)
Chloride: 105 mmol/L (ref 98–111)
Creatinine, Ser: 1.77 mg/dL — ABNORMAL HIGH (ref 0.44–1.00)
GFR calc Af Amer: 33 mL/min — ABNORMAL LOW (ref >60)
GFR calc non Af Amer: 28 mL/min — ABNORMAL LOW (ref >60)
Glucose, Bld: 141 mg/dL — ABNORMAL HIGH (ref 70–99)
Potassium: 4.1 mmol/L (ref 3.5–5.1)
Sodium: 137 mmol/L (ref 135–145)
Total Bilirubin: 0.7 mg/dL (ref 0.3–1.2)
Total Protein: 6.8 g/dL (ref 6.5–8.1)

## 2020-02-12 LAB — CBC
HCT: 35.3 % — ABNORMAL LOW (ref 36.0–46.0)
Hemoglobin: 11.2 g/dL — ABNORMAL LOW (ref 12.0–15.0)
MCH: 29.4 pg (ref 26.0–34.0)
MCHC: 31.7 g/dL (ref 30.0–36.0)
MCV: 92.7 fL (ref 80.0–100.0)
Platelets: 228 10*3/uL (ref 150–400)
RBC: 3.81 MIL/uL — ABNORMAL LOW (ref 3.87–5.11)
RDW: 12.3 % (ref 11.5–15.5)
WBC: 10.6 10*3/uL — ABNORMAL HIGH (ref 4.0–10.5)
nRBC: 0 % (ref 0.0–0.2)

## 2020-02-12 LAB — LIPASE, BLOOD: Lipase: 41 U/L (ref 11–51)

## 2020-02-12 MED ORDER — SODIUM CHLORIDE 0.9% FLUSH: 3.0000 mL | Freq: Once | INTRAVENOUS | Status: DC

## 2020-02-12 MED ORDER — CEPHALEXIN 500 MG PO CAPS
500.0000 mg | ORAL_CAPSULE | Freq: Two times a day (BID) | ORAL | 0 refills | Status: AC
Start: 2020-02-12 — End: 2020-02-19

## 2020-02-12 MED ORDER — FENTANYL CITRATE (PF) 100 MCG/2ML IJ SOLN
50.0000 ug | Freq: Once | INTRAMUSCULAR | Status: AC
  Administered 2020-02-12: 50 ug via INTRAVENOUS
  Filled 2020-02-12: qty 2

## 2020-02-12 MED ORDER — ONDANSETRON HCL 4 MG PO TABS
4.0000 mg | ORAL_TABLET | Freq: Three times a day (TID) | ORAL | 0 refills | Status: AC | PRN
Start: 2020-02-12 — End: ?

## 2020-02-12 MED ORDER — OXYCODONE HCL 5 MG PO TABS: 5.0000 mg | ORAL_TABLET | Freq: Four times a day (QID) | ORAL | 0 refills | Status: AC | PRN

## 2020-02-12 MED ORDER — SODIUM CHLORIDE 0.9 % IV SOLN
2.0000 g | Freq: Once | INTRAVENOUS | Status: AC
  Administered 2020-02-12: 2 g via INTRAVENOUS
  Filled 2020-02-12: qty 20

## 2020-02-12 NOTE — ED Provider Notes (Signed)
[facility] EMERGENCY DEPARTMENT Provider Note   CSN: [id number] Arrival date & time: 02/12/20  1200     History Chief Complaint  Patient presents with  . Flank Pain    Cova Knieriem is a 74 y.o. female.  The history is provided by the patient.  Flank Pain This is a new problem. The problem has been gradually improving. Associated symptoms include abdominal pain (right flank, right lower back). Pertinent negatives include no chest pain, no headaches and no shortness of breath. Nothing aggravates the symptoms. Nothing relieves the symptoms. She has tried nothing for the symptoms. The treatment provided no relief.       Past Medical History:  Diagnosis Date  . B12 deficiency    w chronic gastritis  . Carotid artery stenosis 2008   1-39% stenosis by dopplers 07/2016  . Diastolic dysfunction    no history of CHF  . Elbow fracture   . Fecal incontinence    Hx of this  . Fracture, ankle    right  . Fracture, foot    left  . Hyperlipidemia   . Hypertension   . IBS (irritable bowel syndrome)   . LBBB (left bundle branch block) 01/11/2014  . Lung nodule    right lower lung  . Mild mitral regurgitation   . Multiple sclerosis, relapsing-remitting ([place])   . Perennial allergic rhinitis   . Urinary, incontinence, stress female     Patient Active Problem List   Diagnosis Date Noted  . Acid reflux 07/16/2018  . 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 Fecal incontinence 07/16/2018  . Allergic rhinitis 07/16/2018  . B12 deficiency 07/16/2018  . Benign hypertensive heart disease without CHF 07/16/2018  . Bone/cartilage disorder 07/16/2018  . Diastolic dysfunction   . Carotid artery disease ([place]) 08/13/2016  . Right arm pain 06/23/2014  . Syncope and collapse 06/06/2014  . Crohn's disease ([place]) 06/06/2014  . Multiple sclerosis ([place]) 06/06/2014  . Family history of coronary artery disease in brother 06/06/2014  . PVD (peripheral vascular disease) ([place])  06/06/2014  . Relapsing remitting multiple sclerosis ([place]) 03/13/2014  . Chronic diastolic CHF (congestive heart failure), NYHA class 1 ([place]) 01/11/2014  . LBBB (left bundle branch block) 01/11/2014  . Essential hypertension, benign 01/03/2014  . Hyperlipemia 01/03/2014    Past Surgical History:  Procedure Laterality Date  . CARDIAC CATHETERIZATION  2004   Normal coronary arteries, chronic Exertional dyspnea, cardiolite 2008 normal  . CHOLECYSTECTOMY  07/2000  . SALPINGOOPHORECTOMY     unilateral  . VAGINAL HYSTERECTOMY     and enterocele repair     OB History   No obstetric history on file.     Family History  Problem Relation Age of Onset  . Breast cancer Mother 90  . Hypertension Father   . Heart disease Father   . Hyperlipidemia Father   . CAD Father   . CAD Brother   . Breast cancer Daughter 47    Social History   Tobacco Use  . Smoking status: Never Smoker  . Smokeless tobacco: Never Used  Substance Use Topics  . Alcohol use: No  . Drug use: No    Home Medications Prior to Admission medications   Medication Sig Start Date End Date Taking? Authorizing Provider  Ascorbic Acid (VITAMIN C) 100 MG tablet Take 100 mg by mouth daily.    [provider]  calcium carbonate (OS-CAL) 600 MG TABS tablet Take 600 mg by mouth 2 (two) times daily with a  meal.    [provider]  cephALEXin (KEFLEX) 500 MG capsule Take 1 capsule (500 mg total) by mouth 2 (two) times daily for 7 days. 02/12/20 02/19/20  William Schake, DO  Cholecalciferol (VITAMIN D-3) 1000 units CAPS Take 2 capsules by mouth daily.    [provider]  KLOR-CON M20 20 MEQ tablet TAKE 1 TABLET BY MOUTH EVERY DAY. MUST KEEP UPCOMING APPT FOR REFILLS Patient not taking: Reported on 08/09/2019 11/06/17   Sueanne Margarita, MD  lansoprazole (PREVACID) 30 MG capsule Take 30 mg by mouth daily at 12 noon.    [provider]  lisinopril-hydrochlorothiazide (PRINZIDE,ZESTORETIC) 10-12.5  MG per tablet Take 1 tablet by mouth daily.    [provider]  loperamide (IMODIUM A-D) 2 MG tablet Take 2 mg by mouth daily.    [provider]  NONFORMULARY OR COMPOUNDED ITEM Antifungal solution: Terbinafine 3%, Fluconazole 2%, Tea Tree Oil 5%, Urea 10%, Ibuprofen 2% in DMSO suspension #10mL 06/03/19   Marzetta Board, DPM  NONFORMULARY OR COMPOUNDED ITEM [facility]:  Antifungal topical - Terbinafine 3%, Fluconazole 2%, Tea Tree Oil 5%, Urea 10%, 2% Ibuprofen in #91ml DMSO Suspension. Apply to affected toenail(s) once daily (at bedtime) or twice daily. 07/12/19   Marzetta Board, DPM  ondansetron (ZOFRAN) 4 MG tablet Take 1 tablet (4 mg total) by mouth every 8 (eight) hours as needed for up to 15 doses for nausea or vomiting. 02/12/20   Deanne Bedgood, DO  oxyCODONE (ROXICODONE) 5 MG immediate release tablet Take 1 tablet (5 mg total) by mouth every 6 (six) hours as needed for up to 15 doses. 02/12/20   Shante Archambeault, DO  pravastatin (PRAVACHOL) 40 MG tablet Take 40 mg by mouth daily.    [provider]  pyridOXINE (VITAMIN B-6) 100 MG tablet Take 100 mg by mouth daily.    [provider]  tamsulosin (FLOMAX) 0.4 MG CAPS capsule Take 0.4 mg by mouth daily.    [provider]  triamcinolone cream (KENALOG) 0.1 %  09/22/17   [provider]  vitamin B-12 (CYANOCOBALAMIN) 1000 MCG tablet Take 1,000 mcg by mouth every other day. Three times a week    [provider]  vitamin E 400 UNIT capsule Take 400 Units by mouth daily.    [provider]  zoledronic acid (RECLAST) 5 MG/100ML SOLN injection Inject 5 mg into the vein once.    [provider]    Allergies    Shellfish allergy, Fluzone [flu virus vaccine], Nitrofuran derivatives, and Sulfa antibiotics  Review of Systems   Review of Systems  Constitutional: Negative for chills and fever.  HENT: Negative for ear pain and sore throat.   Eyes: Negative  for pain and visual disturbance.  Respiratory: Negative for cough and shortness of breath.   Cardiovascular: Negative for chest pain and palpitations.  Gastrointestinal: Positive for abdominal pain (right flank, right lower back), nausea and vomiting. Negative for diarrhea.  Genitourinary: Positive for flank pain. Negative for dysuria and hematuria.  Musculoskeletal: Negative for arthralgias and back pain.  Skin: Negative for color change and rash.  Neurological: Negative for seizures, syncope and headaches.  All other systems reviewed and are negative.   Physical Exam Updated Vital Signs BP (!) 145/60 (BP Location: Right Arm)   Pulse 68   Temp 97.6 F (36.4 C) (Oral)   Resp 18   SpO2 100%   Physical Exam Vitals and nursing note reviewed.  Constitutional:  General: She is not in acute distress.    Appearance: She is well-developed. She is not ill-appearing.  HENT:     Head: Normocephalic and atraumatic.     Nose: Nose normal.     Mouth/Throat:     Mouth: Mucous membranes are moist.  Eyes:     Extraocular Movements: Extraocular movements intact.     Conjunctiva/sclera: Conjunctivae normal.     Pupils: Pupils are equal, round, and reactive to light.  Cardiovascular:     Rate and Rhythm: Normal rate and regular rhythm.     Pulses: Normal pulses.     Heart sounds: Normal heart sounds. No murmur heard.   Pulmonary:     Effort: Pulmonary effort is normal. No respiratory distress.     Breath sounds: Normal breath sounds.  Abdominal:     General: Abdomen is flat.     Palpations: Abdomen is soft.     Tenderness: There is no abdominal tenderness. There is right CVA tenderness (right flank, right paraspinal muscles of lumbar spine).  Musculoskeletal:        General: No tenderness. Normal range of motion.     Cervical back: Normal range of motion and neck supple.  Skin:    General: Skin is warm and dry.     Capillary Refill: Capillary refill takes less than 2 seconds.    Neurological:     General: No focal deficit present.     Mental Status: She is alert.  Psychiatric:        Mood and Affect: Mood normal.     ED Results / Procedures / Treatments   Labs (all labs ordered are listed, but only abnormal results are displayed) Labs Reviewed  COMPREHENSIVE METABOLIC PANEL - Abnormal; Notable for the following components:      Result Value   Glucose, Bld 141 (*)    Creatinine, Ser 1.77 (*)    Alkaline Phosphatase 31 (*)    GFR calc non Af Amer 28 (*)    GFR calc Af Amer 33 (*)    All other components within normal limits  CBC - Abnormal; Notable for the following components:   WBC 10.6 (*)    RBC 3.81 (*)    Hemoglobin 11.2 (*)    HCT 35.3 (*)    All other components within normal limits  URINALYSIS, ROUTINE W REFLEX MICROSCOPIC - Abnormal; Notable for the following components:   APPearance HAZY (*)    Hgb urine dipstick MODERATE (*)    Leukocytes,Ua SMALL (*)    RBC / HPF >50 (*)    Bacteria, UA MANY (*)    All other components within normal limits  URINE CULTURE  LIPASE, BLOOD    EKG None  Radiology CT Renal Stone Study  Result Date: 02/12/2020 CLINICAL DATA:  74 year old female with history of flank pain. EXAM: CT ABDOMEN AND PELVIS WITHOUT CONTRAST TECHNIQUE: Multidetector CT imaging of the abdomen and pelvis was performed following the standard protocol without IV contrast. COMPARISON:  No priors. FINDINGS: Lower chest: Small pulmonary nodules are noted in the right lower lobe measuring 4 mm or less in size. Small hiatal hernia. Aortic atherosclerosis. Hepatobiliary: No definite suspicious cystic or solid hepatic lesions are confidently identified on today's noncontrast CT examination. Status post cholecystectomy. Pancreas: No definite pancreatic mass or peripancreatic fluid collections or inflammatory changes are noted on today's noncontrast CT examination. Spleen: Unremarkable. Adrenals/Urinary Tract: 3 mm calcification at the right  ureterovesicular junction with mild to moderate proximal right hydroureteronephrosis  and extensive perinephric stranding. No additional calculi are identified within the collecting system of either kidney, elsewhere along the course of either ureter, or within the lumen of the urinary bladder. No left hydroureteronephrosis. Bilateral adrenal glands are normal in appearance. Urinary bladder is unremarkable in appearance. Stomach/Bowel: Unenhanced appearance of the stomach is normal. There is no pathologic dilatation of small bowel or colon. The appendix is not confidently identified and may be surgically absent. Regardless, there are no inflammatory changes noted adjacent to the cecum to suggest the presence of an acute appendicitis at this time. Vascular/Lymphatic: Aortic atherosclerosis. No lymphadenopathy noted in the abdomen or pelvis. Reproductive: Status post hysterectomy. Ovaries are not confidently identified may be surgically absent or atrophic. Other: No significant volume of ascites.  No pneumoperitoneum. Musculoskeletal: There are no aggressive appearing lytic or blastic lesions noted in the visualized portions of the skeleton. IMPRESSION: 1. 3 mm calcification at the right ureterovesicular junction with mild to moderate proximal right hydroureteronephrosis and extensive perinephric stranding indicative of obstruction. 2. Small pulmonary nodules in the right lower lobe measuring 4 mm or less in size, nonspecific, but statistically likely benign. No follow-up needed if patient is low-risk (and has no known or suspected primary neoplasm). Non-contrast chest CT can be considered in 12 months if patient is high-risk. This recommendation follows the consensus statement: Guidelines for Management of Incidental Pulmonary Nodules Detected on CT Images: From the Fleischner Society 2017; Radiology 2017; 284:228-243. 3. Aortic atherosclerosis. Electronically Signed   By: Vinnie Langton M.D.   On: 02/12/2020 18:31      Procedures Procedures (including critical care time)  Medications Ordered in ED Medications  sodium chloride flush (NS) 0.9 % injection 3 mL (has no administration in time range)  cefTRIAXone (ROCEPHIN) 2 g in sodium chloride 0.9 % 100 mL IVPB (0 g Intravenous Stopped 02/12/20 1811)  fentaNYL (SUBLIMAZE) injection 50 mcg (50 mcg Intravenous Given 02/12/20 1808)    ED Course  I have reviewed the triage vital signs and the nursing notes.  Pertinent labs & imaging results that were available during my care of the patient were reviewed by me and considered in my medical decision making (see chart for details).    MDM Rules/Calculators/A&P                          Delesha Pohlman is a 74 year old female with history of hypertension, high cholesterol presents the ED with right lower back pain, right flank pain.  Normal vitals.  No fever.  Pain woke her up out of sleep.  Has some episodes of nausea and vomiting but now has resolved.  No specific urinary symptoms.  Has had her gallbladder removed in the past.  Denies any chest pain or shortness of breath.  Mostly tenderness in the right flank, right lower back.  No midline spinal tenderness.  No loss of bowel or bladder.  No concern for cauda equina.  Good distal pulses.  Lab work thus far shows possible urinary tract infection.  There is some blood in the urine and will get a CT scan to evaluate for kidney stone versus other intra-abdominal process such as appendicitis.  Suspect could also be an element of musculoskeletal pain as well.  Will give dose of antibiotics.  Lab work is otherwise unremarkable.  No concern for sepsis.  Creatinine mildly elevated at 1.7.  Urinalysis overall equivocal for UTI.  CT scan does show 3 mm kidney stone with some  hydronephrosis.  Patient overall with no fever.  No pain currently.  Discussed on the phone with Dr. Everlena Cooper with urology who is okay with discharge to home which I think is reasonable at this time.   Do not believe that she has severe infection at this time.  Will prescribe Roxicodone, Zofran, Keflex.  She understands return to the ED if she develops any worsening pain, fever.  She will follow up with urology outpatient.  Discharged in good condition.  This chart was dictated using voice recognition software.  Despite best efforts to proofread,  errors can occur which can change the documentation meaning.   Final Clinical Impression(s) / ED Diagnoses Final diagnoses:  Kidney stone    Rx / DC Orders ED Discharge Orders         Ordered    oxyCODONE (ROXICODONE) 5 MG immediate release tablet  Every 6 hours PRN     Discontinue  Reprint     02/12/20 1904    ondansetron (ZOFRAN) 4 MG tablet  Every 8 hours PRN     Discontinue  Reprint     02/12/20 1904    cephALEXin (KEFLEX) 500 MG capsule  2 times daily     Discontinue  Reprint     02/12/20 1904           Jovahn Breit, [place], DO 02/12/20 1907

## 2020-02-12 NOTE — Discharge Instructions (Signed)
Take Tylenol and Motrin as needed for pain.  Take Roxicodone for breakthrough pain.  Take Zofran as needed for nausea.  Continue your Flomax.  Please return to the ED if you have worsening pain, fever.  Take antibiotic as prescribed.

## 2020-02-12 NOTE — ED Notes (Signed)
Patient transported to CT 

## 2020-02-12 NOTE — ED Triage Notes (Addendum)
Pt arrives to ED w/ c/o lower back pain radiating toward RLQ abdomen, that started this morning and woke her up. Pt reports 8/10 pain. Pt endorses n/v. Pt denies fall/injury.

## 2020-02-12 NOTE — ED Notes (Signed)
Pt back from CT

## 2020-02-13 DIAGNOSIS — I10 Essential (primary) hypertension: Secondary | ICD-10-CM | POA: Diagnosis not present

## 2020-02-13 DIAGNOSIS — R911 Solitary pulmonary nodule: Secondary | ICD-10-CM | POA: Diagnosis not present

## 2020-02-13 DIAGNOSIS — G35 Multiple sclerosis: Secondary | ICD-10-CM | POA: Diagnosis not present

## 2020-02-13 DIAGNOSIS — N179 Acute kidney failure, unspecified: Secondary | ICD-10-CM | POA: Diagnosis not present

## 2020-02-13 DIAGNOSIS — N200 Calculus of kidney: Secondary | ICD-10-CM | POA: Diagnosis not present

## 2020-02-13 DIAGNOSIS — R413 Other amnesia: Secondary | ICD-10-CM | POA: Diagnosis not present

## 2020-02-14 ENCOUNTER — Other Ambulatory Visit: Payer: Self-pay | Admitting: Neurology

## 2020-02-14 ENCOUNTER — Telehealth: Payer: Self-pay | Admitting: Neurology

## 2020-02-14 MED ORDER — GLATIRAMER ACETATE 40 MG/ML ~~LOC~~ SOSY: 40.0000 mg | PREFILLED_SYRINGE | SUBCUTANEOUS | 5 refills | Status: DC

## 2020-02-14 NOTE — Telephone Encounter (Signed)
[facility] ,please send over script forGlatiramer Acetate.   Dr. Tomi Likens  if you could please over the script for the pt.

## 2020-02-14 NOTE — Telephone Encounter (Signed)
Sam[facility] with [facility] called and spoke with the Access Nurse. She was calling to verify provider.

## 2020-02-14 NOTE — Telephone Encounter (Signed)
Done

## 2020-02-15 LAB — URINE CULTURE: Culture: >=100000 — AB

## 2020-02-16 ENCOUNTER — Telehealth: Payer: Self-pay

## 2020-02-16 DIAGNOSIS — N179 Acute kidney failure, unspecified: Secondary | ICD-10-CM | POA: Diagnosis not present

## 2020-02-16 DIAGNOSIS — Z5181 Encounter for therapeutic drug level monitoring: Secondary | ICD-10-CM | POA: Diagnosis not present

## 2020-02-16 NOTE — Telephone Encounter (Signed)
Post ED Visit - Positive Culture Follow-up  Culture report reviewed by antimicrobial stewardship pharmacist: [place] Team []  Elenor Quinones, Pharm.D. []  Heide Guile, Pharm.D., BCPS AQ-ID []  Parks Neptune, Pharm.D., BCPS []  Alycia Rossetti, Pharm.D., BCPS []  [place], Pharm.D., BCPS, AAHIVP []  Legrand Como, Pharm.D., BCPS, AAHIVP [x]  Salome Arnt, PharmD, BCPS []  Johnnette Gourd, PharmD, BCPS []  Hughes Better, PharmD, BCPS []  Leeroy Cha, PharmD []  Laqueta Linden, PharmD, BCPS []  Albertina Parr, PharmD  [place] Team []  Leodis Sias, PharmD []  Lindell Spar, PharmD []  Royetta Asal, PharmD []  Graylin Shiver, Rph []  Rema Fendt) Glennon Mac, PharmD []  Arlyn Dunning, PharmD []  Netta Cedars, PharmD []  Dia Sitter, PharmD []  Leone Haven, PharmD []  Gretta Arab, PharmD []  Theodis Shove, PharmD []  Peggyann Juba, PharmD []  Reuel Boom, PharmD   Positive urine culture Treated with Cephalexin, organism sensitive to the same and no further patient follow-up is required at this time.  Genia Del 02/16/2020, 9:32 AM

## 2020-02-20 ENCOUNTER — Telehealth: Payer: Self-pay

## 2020-02-20 ENCOUNTER — Other Ambulatory Visit: Payer: Self-pay | Admitting: Neurology

## 2020-02-20 MED ORDER — GLATIRAMER ACETATE 40 MG/ML ~~LOC~~ SOSY: 40.0000 mg | PREFILLED_SYRINGE | SUBCUTANEOUS | 11 refills | Status: AC

## 2020-02-20 NOTE — Telephone Encounter (Signed)
Telephone call from pt daughter, Per pt insurance the generic more then the Brand name.  Per daughter please send in the brand name Copaxone

## 2020-02-20 NOTE — Telephone Encounter (Signed)
[facility] [address] [facility].

## 2020-02-22 ENCOUNTER — Telehealth: Payer: Self-pay | Admitting: Neurology

## 2020-02-22 NOTE — Telephone Encounter (Signed)
Pt advised to call her PCP for referral.

## 2020-02-22 NOTE — Telephone Encounter (Signed)
Patient's daughter called in wanting to get an earlier appointment. I let her know that December is there earliest and she is already on the wait list. She would like a referral to [facility] for a second opinion. Their fax is [phone] and phone is [phone].

## 2020-02-23 ENCOUNTER — Telehealth: Payer: Self-pay

## 2020-02-23 NOTE — Telephone Encounter (Signed)
Telephone call from pt daughter, Please send records to [facility] [phone].  Advised daughter pt needs to sign a medical release. Per pt daughter, She will inform mother she has to sign release sent to her and they will send it back.

## 2020-02-23 NOTE — Telephone Encounter (Signed)
[facility] is on the same medical record system that we are so they should be able to see our notes.

## 2020-03-01 NOTE — Progress Notes (Deleted)
NEUROLOGY FOLLOW UP OFFICE NOTE  [name] [id number]  HISTORY OF PRESENT ILLNESS: Kristina Donovan is a 74 year old right-handed woman with multiple sclerosis, hypertension, hyperlipidemia, mild carotid artery disease, LBBB, mild MR, IBS and diastolic dysfunction who follows up for multiple sclerosis.  UPDATE: Current disease modifying therapy: none Other medication: D3 2000 IU daily, Prevagin  ***.  She requested to restart Copaxone, but now it is very expensive with her insurance, even the generic.    Vision: No issues Motor: No issues Sensory: No issues Pain: No issues Gait: No issues Bowel/Bladder: Has increased urinary urgency and frequency. On Flomax. Fatigue: No issues Cognition: Sometimes "forgetful".  Mood: No issues   HISTORY: She first exhibited symptoms in 1984. At that time she developed left leg numbness. In 1985, she developed right leg numbness. She then exhibited lower extremity numbness in 1986 as well. No definitive diagnosis was ever made at that time. She was doing well until 1997. At that time she developed leg numbness again. She reportedly had imaging of the brain, likely MRI with contrast. No lumbar puncture was performed. It was determined that she likely had multiple sclerosis but she was not started on a disease modifying agent because her symptoms had completely resolved.In 2005, she had another flareup of left lower extremity numbness, this time associated with weakness. This followed a flu shot. She was not treated with IV steroids.She had an MRI of the brain with and without contrast performed 10/16/03 for right sided numbness and weakness.It revealed multiple, many ovoid-shaped, foci of hyperintensity in the bilateral periventricular white matter, including the callosal septal interface.No abnormal enhancement.MRA of the head was unremarkable.She was started on Betaseron. She experienced flulike symptoms, but  otherwise she tolerated it well. Since she did not have any further flareups, except for some mild symptoms of numbness, Betaseron was discontinued in 2013, since she was clinically and radiographically stable.In August 2015, she developed sudden onset of vertical diplopia. She denied any blurred vision or eye pain. She denied any lower extremity symptoms.   In November 2015, she had an episode where she lost consciousness, falling and hitting her face. No seizure-like activity was witnessed. She was not postictal. EEG performed on 06/06/14 was normal. CT of the head performed on 06/07/14 showed nothing acute. She was evaluated by cardiology. 2D echo showed LVEF 55-60%. She wore a heart monitor for a week, which was unremarkable. She has not had any other episodes of syncope. Etiology is unknown but it may have been dehydration.   03/23/14 MRI of the brain with and without contrast: stable compared to prior study from 2005. 03/23/14 MRI of the cervical spine with and without contrast: non-enhancing plaques at C2 and C3 levels, as well as degenerative arthritic changes. 07/31/16 MRI brain and cervical spine with and without contrast: Unchanged from prior MRI on 03/23/2014 with no new or active lesions. PAST MEDICAL HISTORY: Past Medical History:  Diagnosis Date  . B12 deficiency    w chronic gastritis  . Carotid artery stenosis 2008   1-39% stenosis by dopplers 07/2016  . Diastolic dysfunction    no history of CHF  . Elbow fracture   . Fecal incontinence    Hx of this  . Fracture, ankle    right  . Fracture, foot    left  . Hyperlipidemia   . Hypertension   . IBS (irritable bowel syndrome)   . LBBB (left bundle branch block) 01/11/2014  . Lung nodule    right lower  lung  . Mild mitral regurgitation   . Multiple sclerosis, relapsing-remitting ([place])   . Perennial allergic rhinitis   . Urinary, incontinence, stress female     MEDICATIONS: Current Outpatient Medications on File  Prior to Visit  Medication Sig Dispense Refill  . Ascorbic Acid (VITAMIN C) 100 MG tablet Take 100 mg by mouth daily.    . calcium carbonate (OS-CAL) 600 MG TABS tablet Take 600 mg by mouth 2 (two) times daily with a meal.    . Cholecalciferol (VITAMIN D-3) 1000 units CAPS Take 2 capsules by mouth daily.    Marland Kitchen Glatiramer Acetate (COPAXONE) 40 MG/ML SOSY Inject 40 mg into the skin 3 (three) times a week. At least 48 hours apart.  Brand name only. 12 mL 11  . KLOR-CON M20 20 MEQ tablet TAKE 1 TABLET BY MOUTH EVERY DAY. MUST KEEP UPCOMING APPT FOR REFILLS (Patient not taking: Reported on 08/09/2019) 30 tablet 8  . lansoprazole (PREVACID) 30 MG capsule Take 30 mg by mouth daily at 12 noon.    Marland Kitchen lisinopril-hydrochlorothiazide (PRINZIDE,ZESTORETIC) 10-12.5 MG per tablet Take 1 tablet by mouth daily.    Marland Kitchen loperamide (IMODIUM A-D) 2 MG tablet Take 2 mg by mouth daily.    . NONFORMULARY OR COMPOUNDED ITEM Antifungal solution: Terbinafine 3%, Fluconazole 2%, Tea Tree Oil 5%, Urea 10%, Ibuprofen 2% in DMSO suspension #29mL 1 each 3  . NONFORMULARY OR COMPOUNDED ITEM [facility]:  Antifungal topical - Terbinafine 3%, Fluconazole 2%, Tea Tree Oil 5%, Urea 10%, 2% Ibuprofen in #67ml DMSO Suspension. Apply to affected toenail(s) once daily (at bedtime) or twice daily. 30 each 11  . ondansetron (ZOFRAN) 4 MG tablet Take 1 tablet (4 mg total) by mouth every 8 (eight) hours as needed for up to 15 doses for nausea or vomiting. 15 tablet 0  . oxyCODONE (ROXICODONE) 5 MG immediate release tablet Take 1 tablet (5 mg total) by mouth every 6 (six) hours as needed for up to 15 doses. 15 tablet 0  . pravastatin (PRAVACHOL) 40 MG tablet Take 40 mg by mouth daily.    Marland Kitchen pyridOXINE (VITAMIN B-6) 100 MG tablet Take 100 mg by mouth daily.    . tamsulosin (FLOMAX) 0.4 MG CAPS capsule Take 0.4 mg by mouth daily.    Marland Kitchen triamcinolone cream (KENALOG) 0.1 %     . vitamin B-12 (CYANOCOBALAMIN) 1000 MCG tablet Take 1,000 mcg by  mouth every other day. Three times a week    . vitamin E 400 UNIT capsule Take 400 Units by mouth daily.    . zoledronic acid (RECLAST) 5 MG/100ML SOLN injection Inject 5 mg into the vein once.     No current facility-administered medications on file prior to visit.    ALLERGIES: Allergies  Allergen Reactions  . Shellfish Allergy Anaphylaxis  . Fluzone [Flu Virus Vaccine]     Increased MS  . Nitrofuran Derivatives Diarrhea  . Sulfa Antibiotics Hives    FAMILY HISTORY: Family History  Problem Relation Age of Onset  . Breast cancer Mother 21  . Hypertension Father   . Heart disease Father   . Hyperlipidemia Father   . CAD Father   . CAD Brother   . Breast cancer Daughter 35   ***.  SOCIAL HISTORY: Social History   Socioeconomic History  . Marital status: Widowed    Spouse name: Not on file  . Number of children: Not on file  . Years of education: Not on file  . Highest education level:  Not on file  Occupational History  . Not on file  Tobacco Use  . Smoking status: Never Smoker  . Smokeless tobacco: Never Used  Substance and Sexual Activity  . Alcohol use: No  . Drug use: No  . Sexual activity: Never    Partners: Male  Other Topics Concern  . Not on file  Social History Narrative  . Not on file   Social Determinants of Health   Financial Resource Strain:   . Difficulty of Paying Living Expenses:   Food Insecurity:   . Worried About Charity fundraiser in the Last Year:   . Arboriculturist in the Last Year:   Transportation Needs:   . Film/video editor (Medical):   Marland Kitchen Lack of Transportation (Non-Medical):   Physical Activity:   . Days of Exercise per Week:   . Minutes of Exercise per Session:   Stress:   . Feeling of Stress :   Social Connections:   . Frequency of Communication with Friends and Family:   . Frequency of Social Gatherings with Friends and Family:   . Attends Religious Services:   . Active Member of Clubs or Organizations:   .  Attends Archivist Meetings:   Marland Kitchen Marital Status:   Intimate Partner Violence:   . Fear of Current or Ex-Partner:   . Emotionally Abused:   Marland Kitchen Physically Abused:   . Sexually Abused:     PHYSICAL EXAM: *** General: No acute distress.  Patient appears well-groomed.   Head:  Normocephalic/atraumatic Eyes:  Fundi examined but not visualized Neck: supple, no paraspinal tenderness, full range of motion Heart:  Regular rate and rhythm Lungs:  Clear to auscultation bilaterally Back: No paraspinal tenderness Neurological Exam: alert and oriented to person, place, and time. Attention span and concentration intact, recent and remote memory intact, fund of knowledge intact.  Speech fluent and not dysarthric, language intact.  Left surgical pupil.  Otherwise, CN II-XII intact. Bulk and tone normal, muscle strength 5/5 throughout.  Sensation to light touch, temperature and vibration intact.  Deep tendon reflexes 2+ throughout, toes downgoing.  Finger to nose and heel to shin testing intact.  Gait normal, Romberg negative.  IMPRESSION: ***  PLAN: ***  Metta Clines, DO  CC: ***

## 2020-03-02 ENCOUNTER — Ambulatory Visit: Payer: Self-pay | Admitting: Neurology

## 2020-03-02 DIAGNOSIS — I10 Essential (primary) hypertension: Secondary | ICD-10-CM | POA: Diagnosis not present

## 2020-03-02 DIAGNOSIS — Z5181 Encounter for therapeutic drug level monitoring: Secondary | ICD-10-CM | POA: Diagnosis not present

## 2020-03-02 DIAGNOSIS — N183 Chronic kidney disease, stage 3 unspecified: Secondary | ICD-10-CM | POA: Diagnosis not present

## 2020-03-05 ENCOUNTER — Ambulatory Visit (INDEPENDENT_AMBULATORY_CARE_PROVIDER_SITE_OTHER): Payer: Medicare Other | Admitting: Podiatry

## 2020-03-05 ENCOUNTER — Other Ambulatory Visit: Payer: Self-pay

## 2020-03-05 ENCOUNTER — Encounter: Payer: Self-pay | Admitting: Podiatry

## 2020-03-05 DIAGNOSIS — B351 Tinea unguium: Secondary | ICD-10-CM | POA: Diagnosis not present

## 2020-03-05 DIAGNOSIS — M79676 Pain in unspecified toe(s): Secondary | ICD-10-CM

## 2020-03-08 NOTE — Progress Notes (Signed)
Subjective: Kristina Donovan is a 74 y.o. female patient seen today painful mycotic nails b/l that are difficult to trim. Pain interferes with ambulation. Aggravating factors include wearing enclosed shoe gear. Pain is relieved with periodic professional debridement.    She states she doesn't feel the topical antifungal is working, so she stopped using it.   Patient Active Problem List   Diagnosis Date Noted  . Acid reflux 07/16/2018  . 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 Fecal incontinence 07/16/2018  . Allergic rhinitis 07/16/2018  . B12 deficiency 07/16/2018  . Benign hypertensive heart disease without CHF 07/16/2018  . Bone/cartilage disorder 07/16/2018  . Diastolic dysfunction   . Carotid artery disease ([place]) 08/13/2016  . Right arm pain 06/23/2014  . Syncope and collapse 06/06/2014  . Crohn's disease ([place]) 06/06/2014  . Multiple sclerosis ([place]) 06/06/2014  . Family history of coronary artery disease in brother 06/06/2014  . PVD (peripheral vascular disease) ([place]) 06/06/2014  . Relapsing remitting multiple sclerosis ([place]) 03/13/2014  . Chronic diastolic CHF (congestive heart failure), NYHA class 1 ([place]) 01/11/2014  . LBBB (left bundle branch block) 01/11/2014  . Essential hypertension, benign 01/03/2014  . Hyperlipemia 01/03/2014    Current Outpatient Medications on File Prior to Visit  Medication Sig Dispense Refill  . Ascorbic Acid (VITAMIN C) 100 MG tablet Take 100 mg by mouth daily.    . calcium carbonate (OS-CAL) 600 MG TABS tablet Take 600 mg by mouth 2 (two) times daily with a meal.    . Cholecalciferol (VITAMIN D-3) 1000 units CAPS Take 2 capsules by mouth daily.    . fluticasone (FLONASE) 50 MCG/ACT nasal spray Place into both nostrils.    Marland Kitchen Glatiramer Acetate (COPAXONE) 40 MG/ML SOSY Inject 40 mg into the skin 3 (three) times a week. At least 48 hours apart.  Brand name only. 12 mL 11  . KLOR-CON M20 20 MEQ tablet TAKE 1 TABLET BY MOUTH EVERY DAY. MUST KEEP  UPCOMING APPT FOR REFILLS (Patient not taking: Reported on 08/09/2019) 30 tablet 8  . lansoprazole (PREVACID) 30 MG capsule Take 30 mg by mouth daily at 12 noon.    Marland Kitchen lisinopril-hydrochlorothiazide (PRINZIDE,ZESTORETIC) 10-12.5 MG per tablet Take 1 tablet by mouth daily.    Marland Kitchen loperamide (IMODIUM A-D) 2 MG tablet Take 2 mg by mouth daily.    . NONFORMULARY OR COMPOUNDED ITEM Antifungal solution: Terbinafine 3%, Fluconazole 2%, Tea Tree Oil 5%, Urea 10%, Ibuprofen 2% in DMSO suspension #32mL 1 each 3  . NONFORMULARY OR COMPOUNDED ITEM [facility]:  Antifungal topical - Terbinafine 3%, Fluconazole 2%, Tea Tree Oil 5%, Urea 10%, 2% Ibuprofen in #73ml DMSO Suspension. Apply to affected toenail(s) once daily (at bedtime) or twice daily. 30 each 11  . ondansetron (ZOFRAN) 4 MG tablet Take 1 tablet (4 mg total) by mouth every 8 (eight) hours as needed for up to 15 doses for nausea or vomiting. 15 tablet 0  . oxyCODONE (ROXICODONE) 5 MG immediate release tablet Take 1 tablet (5 mg total) by mouth every 6 (six) hours as needed for up to 15 doses. 15 tablet 0  . pravastatin (PRAVACHOL) 40 MG tablet Take 40 mg by mouth daily.    Marland Kitchen pyridOXINE (VITAMIN B-6) 100 MG tablet Take 100 mg by mouth daily.    . tamsulosin (FLOMAX) 0.4 MG CAPS capsule Take 0.4 mg by mouth daily.    Marland Kitchen triamcinolone cream (KENALOG) 0.1 %     . vitamin B-12 (CYANOCOBALAMIN) 1000 MCG tablet Take 1,000 mcg by  mouth every other day. Three times a week    . vitamin E 400 UNIT capsule Take 400 Units by mouth daily.    . zoledronic acid (RECLAST) 5 MG/100ML SOLN injection Inject 5 mg into the vein once.     No current facility-administered medications on file prior to visit.    Allergies  Allergen Reactions  . Shellfish Allergy Anaphylaxis  . Fluzone [Flu Virus Vaccine]     Increased MS  . Nitrofuran Derivatives Diarrhea  . Sulfa Antibiotics Hives    Objective: Physical Exam  General: Kristina Donovan is a pleasant 74 year old  Caucasian female, well-developed, well-nourished in no acute distress.  Alert awake and oriented x3.  Neurovascular status unchanged b/l.  Capillary refill time to digits remained immediate bilaterally.  Dorsalis pedis and posterior tibial pulses remain palpable bilaterally.  Digital hair is noted to be present bilaterally.  Skin temperature gradient remains within normal limits bilaterally.  There is no edema noted bilaterally.  Negative pain with calf compression noted bilaterally.  Protective sensation is noted to be intact 5/5 bilaterally with 10 g monofilament bilaterally.  Vibratory sensation remains intact bilaterally.  Dermatological examination: Her skin is noted to exhibit normal turgor turgor, texture, and tone bilaterally.  There are no open wounds noted bilaterally.  Toenails 1 through 5 bilaterally are noted to be elongated, dystrophic, discolored and thickened. There is continued improvement in the appearance of her toenails.  Musculoskeletal examination: Muscle strength is noted to be within normal limits 5/5 to all lower extremity muscle groups bilaterally.  There are no gross bony deformities noted bilaterally.  There is no pain, no crepitus or joint limitation with active or passive range of motion bilaterally.  Assessment: Painful mycotic toenails 1 through 5 bilaterally  Plan: 1.  Toenails 1 through 5 bilaterally were debrided in length and girth without complication with sterile nail nippers and dremel. 2.  I encouraged her to continue compounded topical antifungal solution from [place], applying medication to toenails three times per week at a time of day that is convenient for her. She related understanding and stated she will comply. 3.  She is to continue soft supportive shoe gear daily. 4.  Report any pedal injuries to medical professional immediately. 5.  Patient is to call should there be any questions or concerns in the interim.   Return in about 3 months  (around 06/05/2020) for nail trim.  Marzetta Board, DPM

## 2020-03-09 DIAGNOSIS — G35 Multiple sclerosis: Secondary | ICD-10-CM | POA: Diagnosis not present

## 2020-03-09 DIAGNOSIS — R413 Other amnesia: Secondary | ICD-10-CM | POA: Diagnosis not present

## 2020-03-09 DIAGNOSIS — R4189 Other symptoms and signs involving cognitive functions and awareness: Secondary | ICD-10-CM | POA: Diagnosis not present

## 2020-03-31 DIAGNOSIS — M47812 Spondylosis without myelopathy or radiculopathy, cervical region: Secondary | ICD-10-CM | POA: Diagnosis not present

## 2020-03-31 DIAGNOSIS — M4802 Spinal stenosis, cervical region: Secondary | ICD-10-CM | POA: Diagnosis not present

## 2020-03-31 DIAGNOSIS — E042 Nontoxic multinodular goiter: Secondary | ICD-10-CM | POA: Diagnosis not present

## 2020-03-31 DIAGNOSIS — G35 Multiple sclerosis: Secondary | ICD-10-CM | POA: Diagnosis not present

## 2020-03-31 DIAGNOSIS — E079 Disorder of thyroid, unspecified: Secondary | ICD-10-CM | POA: Diagnosis not present

## 2020-04-12 DIAGNOSIS — G35 Multiple sclerosis: Secondary | ICD-10-CM | POA: Diagnosis not present

## 2020-04-12 DIAGNOSIS — E042 Nontoxic multinodular goiter: Secondary | ICD-10-CM | POA: Diagnosis not present

## 2020-04-12 DIAGNOSIS — R413 Other amnesia: Secondary | ICD-10-CM | POA: Diagnosis not present

## 2020-04-19 DIAGNOSIS — Z888 Allergy status to other drugs, medicaments and biological substances status: Secondary | ICD-10-CM | POA: Diagnosis not present

## 2020-04-19 DIAGNOSIS — Z882 Allergy status to sulfonamides status: Secondary | ICD-10-CM | POA: Diagnosis not present

## 2020-04-19 DIAGNOSIS — R413 Other amnesia: Secondary | ICD-10-CM | POA: Diagnosis not present

## 2020-04-19 DIAGNOSIS — E041 Nontoxic single thyroid nodule: Secondary | ICD-10-CM | POA: Diagnosis not present

## 2020-04-19 DIAGNOSIS — E042 Nontoxic multinodular goiter: Secondary | ICD-10-CM | POA: Diagnosis not present

## 2020-04-19 DIAGNOSIS — D440 Neoplasm of uncertain behavior of thyroid gland: Secondary | ICD-10-CM | POA: Diagnosis not present

## 2020-04-23 ENCOUNTER — Ambulatory Visit: Payer: Self-pay | Admitting: Neurology

## 2020-05-15 DIAGNOSIS — E041 Nontoxic single thyroid nodule: Secondary | ICD-10-CM | POA: Diagnosis not present

## 2020-05-16 DIAGNOSIS — D440 Neoplasm of uncertain behavior of thyroid gland: Secondary | ICD-10-CM | POA: Diagnosis not present

## 2020-05-18 DIAGNOSIS — D510 Vitamin B12 deficiency anemia due to intrinsic factor deficiency: Secondary | ICD-10-CM | POA: Diagnosis not present

## 2020-05-18 DIAGNOSIS — E7800 Pure hypercholesterolemia, unspecified: Secondary | ICD-10-CM | POA: Diagnosis not present

## 2020-05-18 DIAGNOSIS — M810 Age-related osteoporosis without current pathological fracture: Secondary | ICD-10-CM | POA: Diagnosis not present

## 2020-05-18 DIAGNOSIS — N183 Chronic kidney disease, stage 3 unspecified: Secondary | ICD-10-CM | POA: Diagnosis not present

## 2020-05-18 DIAGNOSIS — I10 Essential (primary) hypertension: Secondary | ICD-10-CM | POA: Diagnosis not present

## 2020-06-06 ENCOUNTER — Ambulatory Visit (INDEPENDENT_AMBULATORY_CARE_PROVIDER_SITE_OTHER): Payer: Medicare Other | Admitting: Podiatry

## 2020-06-06 ENCOUNTER — Encounter: Payer: Self-pay | Admitting: Podiatry

## 2020-06-06 ENCOUNTER — Other Ambulatory Visit: Payer: Self-pay

## 2020-06-06 DIAGNOSIS — B351 Tinea unguium: Secondary | ICD-10-CM

## 2020-06-06 DIAGNOSIS — M79676 Pain in unspecified toe(s): Secondary | ICD-10-CM | POA: Diagnosis not present

## 2020-06-09 NOTE — Progress Notes (Signed)
Subjective: Kristina Donovan is a 74 y.o. female patient seen today painful mycotic nails b/l that are difficult to trim. Pain interferes with ambulation. Aggravating factors include wearing enclosed shoe gear. Pain is relieved with periodic professional debridement.    She voices no new pedal problems on today's visit.   Patient Active Problem List   Diagnosis Date Noted  . Acid reflux 07/16/2018  . 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 Fecal incontinence 07/16/2018  . Allergic rhinitis 07/16/2018  . B12 deficiency 07/16/2018  . Benign hypertensive heart disease without CHF 07/16/2018  . Bone/cartilage disorder 07/16/2018  . Diastolic dysfunction   . Carotid artery disease ([place]) 08/13/2016  . Right arm pain 06/23/2014  . Syncope and collapse 06/06/2014  . Crohn's disease ([place]) 06/06/2014  . Multiple sclerosis ([place]) 06/06/2014  . Family history of coronary artery disease in brother 06/06/2014  . PVD (peripheral vascular disease) ([place]) 06/06/2014  . Relapsing remitting multiple sclerosis ([place]) 03/13/2014  . Chronic diastolic CHF (congestive heart failure), NYHA class 1 ([place]) 01/11/2014  . LBBB (left bundle branch block) 01/11/2014  . Essential hypertension, benign 01/03/2014  . Hyperlipemia 01/03/2014    Current Outpatient Medications on File Prior to Visit  Medication Sig Dispense Refill  . donepezil (ARICEPT) 5 MG tablet Take by mouth.    . Ascorbic Acid (VITAMIN C) 100 MG tablet Take 100 mg by mouth daily.    . calcium carbonate (OS-CAL) 600 MG TABS tablet Take 600 mg by mouth 2 (two) times daily with a meal.    . Cholecalciferol (VITAMIN D-3) 1000 units CAPS Take 2 capsules by mouth daily.    . fluticasone (FLONASE) 50 MCG/ACT nasal spray Place into both nostrils.    Marland Kitchen Glatiramer Acetate (COPAXONE) 40 MG/ML SOSY Inject 40 mg into the skin 3 (three) times a week. At least 48 hours apart.  Brand name only. 12 mL 11  . KLOR-CON M20 20 MEQ tablet TAKE 1 TABLET BY MOUTH EVERY DAY.  MUST KEEP UPCOMING APPT FOR REFILLS (Patient not taking: Reported on 08/09/2019) 30 tablet 8  . lansoprazole (PREVACID) 30 MG capsule Take 30 mg by mouth daily at 12 noon.    Marland Kitchen lisinopril-hydrochlorothiazide (PRINZIDE,ZESTORETIC) 10-12.5 MG per tablet Take 1 tablet by mouth daily.    Marland Kitchen loperamide (IMODIUM A-D) 2 MG tablet Take 2 mg by mouth daily.    . NONFORMULARY OR COMPOUNDED ITEM Antifungal solution: Terbinafine 3%, Fluconazole 2%, Tea Tree Oil 5%, Urea 10%, Ibuprofen 2% in DMSO suspension #62mL 1 each 3  . NONFORMULARY OR COMPOUNDED ITEM [facility]:  Antifungal topical - Terbinafine 3%, Fluconazole 2%, Tea Tree Oil 5%, Urea 10%, 2% Ibuprofen in #48ml DMSO Suspension. Apply to affected toenail(s) once daily (at bedtime) or twice daily. 30 each 11  . omeprazole (PRILOSEC) 20 MG capsule Take by mouth.    . ondansetron (ZOFRAN) 4 MG tablet Take 1 tablet (4 mg total) by mouth every 8 (eight) hours as needed for up to 15 doses for nausea or vomiting. 15 tablet 0  . oxyCODONE (ROXICODONE) 5 MG immediate release tablet Take 1 tablet (5 mg total) by mouth every 6 (six) hours as needed for up to 15 doses. 15 tablet 0  . pravastatin (PRAVACHOL) 40 MG tablet Take 40 mg by mouth daily.    Marland Kitchen pyridOXINE (VITAMIN B-6) 100 MG tablet Take 100 mg by mouth daily.    . tamsulosin (FLOMAX) 0.4 MG CAPS capsule Take 0.4 mg by mouth daily.    Marland Kitchen triamcinolone cream (  KENALOG) 0.1 %     . vitamin B-12 (CYANOCOBALAMIN) 1000 MCG tablet Take 1,000 mcg by mouth every other day. Three times a week    . vitamin E 400 UNIT capsule Take 400 Units by mouth daily.    . zoledronic acid (RECLAST) 5 MG/100ML SOLN injection Inject 5 mg into the vein once.     No current facility-administered medications on file prior to visit.    Allergies  Allergen Reactions  . Shellfish Allergy Anaphylaxis  . Fluzone [Flu Virus Vaccine]     Increased MS  . Hemophilus B Polysaccharide Vaccine     Other reaction(s): Other (See  Comments) Increased MS  . Nitrofuran Derivatives Diarrhea  . Sulfa Antibiotics Hives    Objective: Physical Exam  General: Kristina Donovan is a pleasant 74 year old Caucasian female, well-developed, well-nourished in no acute distress.  Alert awake and oriented x3.  Neurovascular status unchanged b/l.  Capillary refill time to digits remained immediate bilaterally.  Dorsalis pedis and posterior tibial pulses remain palpable bilaterally.  Digital hair is noted to be present bilaterally.  Skin temperature gradient remains within normal limits bilaterally.  There is no edema noted bilaterally.  Negative pain with calf compression noted bilaterally.  Protective sensation is noted to be intact 5/5 bilaterally with 10 g monofilament bilaterally.  Vibratory sensation remains intact bilaterally.  Dermatological examination: Her skin is noted to exhibit normal turgor turgor, texture, and tone bilaterally.  There are no open wounds noted bilaterally.  Toenails 1 through 5 bilaterally are noted to be elongated, dystrophic, discolored and thickened.   Musculoskeletal examination: Muscle strength is noted to be within normal limits 5/5 to all lower extremity muscle groups bilaterally.  There are no gross bony deformities noted bilaterally.  There is no pain, no crepitus or joint limitation with active or passive range of motion bilaterally.  Assessment: Painful mycotic toenails 1 through 5 bilaterally  Plan: 1.  Toenails 1 through 5 bilaterally were debrided in length and girth without complication with sterile nail nippers and dremel. 2.  She is to continue soft supportive shoe gear daily. 3.  Report any pedal injuries to medical professional immediately. 4.  Patient is to call should there be any questions or concerns in the interim.   Return in about 3 months (around 09/06/2020) for painful mycotic toenails.  Marzetta Board, DPM

## 2020-06-13 ENCOUNTER — Other Ambulatory Visit: Payer: Self-pay | Admitting: Internal Medicine

## 2020-06-13 DIAGNOSIS — Z1231 Encounter for screening mammogram for malignant neoplasm of breast: Secondary | ICD-10-CM

## 2020-06-13 DIAGNOSIS — L570 Actinic keratosis: Secondary | ICD-10-CM | POA: Diagnosis not present

## 2020-06-21 DIAGNOSIS — R1084 Generalized abdominal pain: Secondary | ICD-10-CM | POA: Diagnosis not present

## 2020-06-21 DIAGNOSIS — Z23 Encounter for immunization: Secondary | ICD-10-CM | POA: Diagnosis not present

## 2020-06-21 DIAGNOSIS — R197 Diarrhea, unspecified: Secondary | ICD-10-CM | POA: Diagnosis not present

## 2020-06-22 DIAGNOSIS — R197 Diarrhea, unspecified: Secondary | ICD-10-CM | POA: Diagnosis not present

## 2020-06-22 DIAGNOSIS — R1084 Generalized abdominal pain: Secondary | ICD-10-CM | POA: Diagnosis not present

## 2020-08-01 DIAGNOSIS — D510 Vitamin B12 deficiency anemia due to intrinsic factor deficiency: Secondary | ICD-10-CM | POA: Diagnosis not present

## 2020-08-01 DIAGNOSIS — I10 Essential (primary) hypertension: Secondary | ICD-10-CM | POA: Diagnosis not present

## 2020-08-01 DIAGNOSIS — E7800 Pure hypercholesterolemia, unspecified: Secondary | ICD-10-CM | POA: Diagnosis not present

## 2020-08-01 DIAGNOSIS — N1831 Chronic kidney disease, stage 3a: Secondary | ICD-10-CM | POA: Diagnosis not present

## 2020-08-01 DIAGNOSIS — M810 Age-related osteoporosis without current pathological fracture: Secondary | ICD-10-CM | POA: Diagnosis not present

## 2020-08-03 DIAGNOSIS — E210 Primary hyperparathyroidism: Secondary | ICD-10-CM | POA: Diagnosis not present

## 2020-08-09 ENCOUNTER — Ambulatory Visit: Payer: Medicare Other | Admitting: Neurology

## 2020-08-13 DIAGNOSIS — D0439 Carcinoma in situ of skin of other parts of face: Secondary | ICD-10-CM | POA: Diagnosis not present

## 2020-08-13 DIAGNOSIS — L570 Actinic keratosis: Secondary | ICD-10-CM | POA: Diagnosis not present

## 2020-08-13 DIAGNOSIS — D485 Neoplasm of uncertain behavior of skin: Secondary | ICD-10-CM | POA: Diagnosis not present

## 2020-08-21 DIAGNOSIS — R413 Other amnesia: Secondary | ICD-10-CM | POA: Diagnosis not present

## 2020-08-21 DIAGNOSIS — N200 Calculus of kidney: Secondary | ICD-10-CM | POA: Diagnosis not present

## 2020-08-21 DIAGNOSIS — I519 Heart disease, unspecified: Secondary | ICD-10-CM | POA: Diagnosis not present

## 2020-08-21 DIAGNOSIS — I10 Essential (primary) hypertension: Secondary | ICD-10-CM | POA: Diagnosis not present

## 2020-08-21 DIAGNOSIS — G35 Multiple sclerosis: Secondary | ICD-10-CM | POA: Diagnosis not present

## 2020-08-21 DIAGNOSIS — M810 Age-related osteoporosis without current pathological fracture: Secondary | ICD-10-CM | POA: Diagnosis not present

## 2020-08-21 DIAGNOSIS — Z1389 Encounter for screening for other disorder: Secondary | ICD-10-CM | POA: Diagnosis not present

## 2020-08-21 DIAGNOSIS — Z0000 Encounter for general adult medical examination without abnormal findings: Secondary | ICD-10-CM | POA: Diagnosis not present

## 2020-08-21 DIAGNOSIS — N1831 Chronic kidney disease, stage 3a: Secondary | ICD-10-CM | POA: Diagnosis not present

## 2020-08-21 DIAGNOSIS — I700 Atherosclerosis of aorta: Secondary | ICD-10-CM | POA: Diagnosis not present

## 2020-08-21 DIAGNOSIS — E7800 Pure hypercholesterolemia, unspecified: Secondary | ICD-10-CM | POA: Diagnosis not present

## 2020-08-22 DIAGNOSIS — I10 Essential (primary) hypertension: Secondary | ICD-10-CM | POA: Diagnosis not present

## 2020-08-22 DIAGNOSIS — N1831 Chronic kidney disease, stage 3a: Secondary | ICD-10-CM | POA: Diagnosis not present

## 2020-08-23 ENCOUNTER — Ambulatory Visit: Payer: Medicare Other | Attending: Internal Medicine

## 2020-08-23 DIAGNOSIS — Z23 Encounter for immunization: Secondary | ICD-10-CM

## 2020-08-23 NOTE — Progress Notes (Signed)
   Covid-19 Vaccination Clinic  Name:  Kristina Donovan    MRN: [id number] DOB: [birth year]  08/23/2020  Kristina Donovan was observed post Covid-19 immunization for 15 minutes without incident. She was provided with Vaccine Information Sheet and instruction to access the V-Safe system.   Kristina Donovan was instructed to call 911 with any severe reactions post vaccine: Marland Kitchen Difficulty breathing  . Swelling of face and throat  . A fast heartbeat  . A bad rash all over body  . Dizziness and weakness   Immunizations Administered    Name Date Dose VIS Date Route   PFIZER Comrnaty(Gray TOP) Covid-19 Vaccine 08/23/2020  1:14 PM 0.3 mL 06/28/2020 Intramuscular   Manufacturer: [place]   Lot: [id number]   NDC: [phone]

## 2020-09-04 DIAGNOSIS — D099 Carcinoma in situ, unspecified: Secondary | ICD-10-CM | POA: Diagnosis not present

## 2020-09-07 ENCOUNTER — Encounter: Payer: Self-pay | Admitting: Podiatry

## 2020-09-07 ENCOUNTER — Other Ambulatory Visit: Payer: Self-pay

## 2020-09-07 ENCOUNTER — Ambulatory Visit (INDEPENDENT_AMBULATORY_CARE_PROVIDER_SITE_OTHER): Payer: Medicare Other | Admitting: Podiatry

## 2020-09-07 DIAGNOSIS — N1831 Chronic kidney disease, stage 3a: Secondary | ICD-10-CM | POA: Insufficient documentation

## 2020-09-07 DIAGNOSIS — B351 Tinea unguium: Secondary | ICD-10-CM | POA: Diagnosis not present

## 2020-09-07 DIAGNOSIS — M79676 Pain in unspecified toe(s): Secondary | ICD-10-CM

## 2020-09-07 DIAGNOSIS — R911 Solitary pulmonary nodule: Secondary | ICD-10-CM | POA: Insufficient documentation

## 2020-09-07 DIAGNOSIS — D510 Vitamin B12 deficiency anemia due to intrinsic factor deficiency: Secondary | ICD-10-CM | POA: Insufficient documentation

## 2020-09-07 DIAGNOSIS — E7800 Pure hypercholesterolemia, unspecified: Secondary | ICD-10-CM | POA: Insufficient documentation

## 2020-09-07 DIAGNOSIS — R413 Other amnesia: Secondary | ICD-10-CM | POA: Insufficient documentation

## 2020-09-07 DIAGNOSIS — I700 Atherosclerosis of aorta: Secondary | ICD-10-CM | POA: Insufficient documentation

## 2020-09-07 DIAGNOSIS — N200 Calculus of kidney: Secondary | ICD-10-CM | POA: Insufficient documentation

## 2020-09-07 DIAGNOSIS — M810 Age-related osteoporosis without current pathological fracture: Secondary | ICD-10-CM | POA: Insufficient documentation

## 2020-09-12 NOTE — Progress Notes (Signed)
Subjective: Kristina Donovan is a 75 y.o. female patient seen today painful mycotic nails b/l that are difficult to trim. Pain interferes with ambulation. Aggravating factors include wearing enclosed shoe gear. Pain is relieved with periodic professional debridement.    She voices no new pedal problems on today's visit.   Allergies  Allergen Reactions  . Shellfish Allergy Anaphylaxis  . Fluzone Quadrivalent [Influenza Vac Split Quad]     Other reaction(s): increased MS sx  . Fluzone [Influenza Virus Vaccine]     Increased MS  . Hemophilus B Polysaccharide Vaccine     Other reaction(s): Other (See Comments) Increased MS  . Nitrofuran Derivatives Diarrhea  . Sulfa Antibiotics Hives   Objective: Physical Exam  General: Kristina Donovan is a pleasant 75 year old Caucasian female, well-developed, well-nourished in no acute distress.  Alert awake and oriented x3.  Neurovascular status unchanged b/l.  Capillary refill time to digits remained immediate bilaterally.  Dorsalis pedis and posterior tibial pulses remain palpable bilaterally.  Digital hair is noted to be present bilaterally.  Skin temperature gradient remains within normal limits bilaterally.  There is no edema noted bilaterally.  Negative pain with calf compression noted bilaterally.  Protective sensation is noted to be intact 5/5 bilaterally with 10 g monofilament bilaterally.  Vibratory sensation remains intact bilaterally.  Dermatological examination: Her skin is noted to exhibit normal turgor turgor, texture, and tone bilaterally.  There are no open wounds noted bilaterally.  Toenails 1 through 5 bilaterally are noted to be elongated, dystrophic, discolored and thickened.   Musculoskeletal examination: Muscle strength is noted to be within normal limits 5/5 to all lower extremity muscle groups bilaterally.  There are no gross bony deformities noted bilaterally.  There is no pain, no crepitus or joint limitation with active or  passive range of motion bilaterally.  Assessment: Painful mycotic toenails 1 through 5 bilaterally  Plan: 1.  No new orders. 2.  Toenails 1 through 5 bilaterally were debrided in length and girth without complication with sterile nail nippers and dremel. 3.  She is to continue soft supportive shoe gear daily. 4.  Report any pedal injuries to medical professional immediately. 5.  Patient is to call should there be any questions or concerns in the interim.   Return in about 3 months (around 12/05/2020).  Marzetta Board, DPM

## 2020-09-17 ENCOUNTER — Ambulatory Visit: Payer: PRIVATE HEALTH INSURANCE

## 2020-09-25 DIAGNOSIS — F03A0 Unspecified dementia, mild, without behavioral disturbance, psychotic disturbance, mood disturbance, and anxiety: Secondary | ICD-10-CM | POA: Insufficient documentation

## 2020-09-25 DIAGNOSIS — Z887 Allergy status to serum and vaccine status: Secondary | ICD-10-CM | POA: Diagnosis not present

## 2020-09-25 DIAGNOSIS — Z888 Allergy status to other drugs, medicaments and biological substances status: Secondary | ICD-10-CM | POA: Diagnosis not present

## 2020-09-25 DIAGNOSIS — Z882 Allergy status to sulfonamides status: Secondary | ICD-10-CM | POA: Diagnosis not present

## 2020-09-25 DIAGNOSIS — R9082 White matter disease, unspecified: Secondary | ICD-10-CM | POA: Diagnosis not present

## 2020-09-25 DIAGNOSIS — F0390 Unspecified dementia without behavioral disturbance: Secondary | ICD-10-CM | POA: Diagnosis not present

## 2020-09-25 DIAGNOSIS — G35 Multiple sclerosis: Secondary | ICD-10-CM | POA: Diagnosis not present

## 2020-09-26 DIAGNOSIS — L578 Other skin changes due to chronic exposure to nonionizing radiation: Secondary | ICD-10-CM | POA: Diagnosis not present

## 2020-09-26 DIAGNOSIS — D225 Melanocytic nevi of trunk: Secondary | ICD-10-CM | POA: Diagnosis not present

## 2020-09-26 DIAGNOSIS — L814 Other melanin hyperpigmentation: Secondary | ICD-10-CM | POA: Diagnosis not present

## 2020-09-26 DIAGNOSIS — T451X5A Adverse effect of antineoplastic and immunosuppressive drugs, initial encounter: Secondary | ICD-10-CM | POA: Diagnosis not present

## 2020-09-26 DIAGNOSIS — L821 Other seborrheic keratosis: Secondary | ICD-10-CM | POA: Diagnosis not present

## 2020-09-26 DIAGNOSIS — D099 Carcinoma in situ, unspecified: Secondary | ICD-10-CM | POA: Diagnosis not present

## 2020-09-26 DIAGNOSIS — Z85828 Personal history of other malignant neoplasm of skin: Secondary | ICD-10-CM | POA: Diagnosis not present

## 2020-10-15 ENCOUNTER — Ambulatory Visit
Admission: RE | Admit: 2020-10-15 | Discharge: 2020-10-15 | Disposition: A | Discharge status: Home / Self Care | Payer: Medicare Other | Source: Ambulatory Visit | Attending: Internal Medicine | Admitting: Internal Medicine

## 2020-10-15 ENCOUNTER — Other Ambulatory Visit: Payer: Self-pay

## 2020-10-15 ENCOUNTER — Ambulatory Visit: Payer: PRIVATE HEALTH INSURANCE

## 2020-10-15 DIAGNOSIS — Z1231 Encounter for screening mammogram for malignant neoplasm of breast: Secondary | ICD-10-CM

## 2020-10-22 DIAGNOSIS — N183 Chronic kidney disease, stage 3 unspecified: Secondary | ICD-10-CM | POA: Diagnosis not present

## 2020-10-22 DIAGNOSIS — D510 Vitamin B12 deficiency anemia due to intrinsic factor deficiency: Secondary | ICD-10-CM | POA: Diagnosis not present

## 2020-10-22 DIAGNOSIS — M810 Age-related osteoporosis without current pathological fracture: Secondary | ICD-10-CM | POA: Diagnosis not present

## 2020-10-22 DIAGNOSIS — E7800 Pure hypercholesterolemia, unspecified: Secondary | ICD-10-CM | POA: Diagnosis not present

## 2020-10-22 DIAGNOSIS — I10 Essential (primary) hypertension: Secondary | ICD-10-CM | POA: Diagnosis not present

## 2020-10-23 DIAGNOSIS — Z79899 Other long term (current) drug therapy: Secondary | ICD-10-CM | POA: Diagnosis not present

## 2020-10-23 DIAGNOSIS — G35 Multiple sclerosis: Secondary | ICD-10-CM | POA: Diagnosis not present

## 2020-11-01 ENCOUNTER — Ambulatory Visit (INDEPENDENT_AMBULATORY_CARE_PROVIDER_SITE_OTHER): Payer: Medicare Other

## 2020-11-01 ENCOUNTER — Other Ambulatory Visit: Payer: Self-pay

## 2020-11-01 ENCOUNTER — Ambulatory Visit (INDEPENDENT_AMBULATORY_CARE_PROVIDER_SITE_OTHER): Payer: Medicare Other | Admitting: Podiatry

## 2020-11-01 ENCOUNTER — Other Ambulatory Visit: Payer: Self-pay | Admitting: Podiatry

## 2020-11-01 DIAGNOSIS — S9031XA Contusion of right foot, initial encounter: Secondary | ICD-10-CM

## 2020-11-01 DIAGNOSIS — M79671 Pain in right foot: Secondary | ICD-10-CM

## 2020-11-06 ENCOUNTER — Encounter: Payer: Self-pay | Admitting: Podiatry

## 2020-11-06 NOTE — Progress Notes (Signed)
Subjective:  Patient ID: Kristina Donovan, female    DOB: [birth year],  MRN: [id number]  Chief Complaint  Patient presents with  . Foot Pain    75 y.o. female presents with the above complaint.  Patient presents with a complaint of a ball hitch falling on top of her foot about 2 weeks ago.  She does not have any pain but she just wanted get evaluated make sure that there is nothing broken.  She states that it fell right on top of the foot there was some bruising afterwards however since then has been fine.  She denies any other acute complaints.  She did not see anyone afterwards.  She said the pain just already going away.  Date of injury was October 19, 2020   Review of Systems: Negative except as noted in the HPI. Denies N/V/F/Ch.  Past Medical History:  Diagnosis Date  . B12 deficiency    w chronic gastritis  . Carotid artery stenosis 2008   1-39% stenosis by dopplers 07/2016  . Diastolic dysfunction    no history of CHF  . Elbow fracture   . Fecal incontinence    Hx of this  . Fracture, ankle    right  . Fracture, foot    left  . Hyperlipidemia   . Hypertension   . IBS (irritable bowel syndrome)   . LBBB (left bundle branch block) 01/11/2014  . Lung nodule    right lower lung  . Mild mitral regurgitation   . Multiple sclerosis, relapsing-remitting ([place])   . Perennial allergic rhinitis   . Urinary, incontinence, stress female     Current Outpatient Medications:  .  Ascorbic Acid (VITAMIN C) 100 MG tablet, Take 100 mg by mouth daily., Disp: , Rfl:  .  calcium carbonate (OS-CAL) 600 MG TABS tablet, Take 600 mg by mouth 2 (two) times daily with a meal., Disp: , Rfl:  .  Cholecalciferol (VITAMIN D-3) 1000 units CAPS, Take 2 capsules by mouth daily., Disp: , Rfl:  .  donepezil (ARICEPT) 5 MG tablet, Take by mouth., Disp: , Rfl:  .  fluticasone (FLONASE) 50 MCG/ACT nasal spray, Place into both nostrils., Disp: , Rfl:  .  Glatiramer Acetate (COPAXONE) 40 MG/ML SOSY,  Inject 40 mg into the skin 3 (three) times a week. At least 48 hours apart.  Brand name only., Disp: 12 mL, Rfl: 11 .  KLOR-CON M20 20 MEQ tablet, TAKE 1 TABLET BY MOUTH EVERY DAY. MUST KEEP UPCOMING APPT FOR REFILLS (Patient not taking: Reported on 08/09/2019), Disp: 30 tablet, Rfl: 8 .  lansoprazole (PREVACID) 30 MG capsule, Take 30 mg by mouth daily at 12 noon., Disp: , Rfl:  .  lisinopril-hydrochlorothiazide (PRINZIDE,ZESTORETIC) 10-12.5 MG per tablet, Take 1 tablet by mouth daily., Disp: , Rfl:  .  loperamide (IMODIUM A-D) 2 MG tablet, Take 2 mg by mouth daily., Disp: , Rfl:  .  NONFORMULARY OR COMPOUNDED ITEM, Antifungal solution: Terbinafine 3%, Fluconazole 2%, Tea Tree Oil 5%, Urea 10%, Ibuprofen 2% in DMSO suspension #64mL, Disp: 1 each, Rfl: 3 .  NONFORMULARY OR COMPOUNDED ITEM, [facility]:  Antifungal topical - Terbinafine 3%, Fluconazole 2%, Tea Tree Oil 5%, Urea 10%, 2% Ibuprofen in #6ml DMSO Suspension. Apply to affected toenail(s) once daily (at bedtime) or twice daily., Disp: 30 each, Rfl: 11 .  omeprazole (PRILOSEC) 20 MG capsule, Take by mouth., Disp: , Rfl:  .  ondansetron (ZOFRAN) 4 MG tablet, Take 1 tablet (4 mg total) by mouth every  8 (eight) hours as needed for up to 15 doses for nausea or vomiting., Disp: 15 tablet, Rfl: 0 .  oxyCODONE (ROXICODONE) 5 MG immediate release tablet, Take 1 tablet (5 mg total) by mouth every 6 (six) hours as needed for up to 15 doses., Disp: 15 tablet, Rfl: 0 .  pravastatin (PRAVACHOL) 40 MG tablet, Take 40 mg by mouth daily., Disp: , Rfl:  .  pyridOXINE (VITAMIN B-6) 100 MG tablet, Take 100 mg by mouth daily., Disp: , Rfl:  .  tamsulosin (FLOMAX) 0.4 MG CAPS capsule, Take 0.4 mg by mouth daily., Disp: , Rfl:  .  triamcinolone cream (KENALOG) 0.1 %, , Disp: , Rfl:  .  vitamin B-12 (CYANOCOBALAMIN) 1000 MCG tablet, Take 1,000 mcg by mouth every other day. Three times a week, Disp: , Rfl:  .  vitamin E 400 UNIT capsule, Take 400 Units by  mouth daily., Disp: , Rfl:  .  zoledronic acid (RECLAST) 5 MG/100ML SOLN injection, Inject 5 mg into the vein once., Disp: , Rfl:   Social History   Tobacco Use  Smoking Status Never Smoker  Smokeless Tobacco Never Used    Allergies  Allergen Reactions  . Shellfish Allergy Anaphylaxis  . Fluzone Quadrivalent [Influenza Vac Split Quad]     Other reaction(s): increased MS sx  . Fluzone [Influenza Virus Vaccine]     Increased MS  . Hemophilus B Polysaccharide Vaccine     Other reaction(s): Other (See Comments) Increased MS  . Nitrofuran Derivatives Diarrhea  . Sulfa Antibiotics Hives   Objective:  There were no vitals filed for this visit. There is no height or weight on file to calculate BMI. Constitutional Well developed. Well nourished.  Vascular Dorsalis pedis pulses palpable bilaterally. Posterior tibial pulses palpable bilaterally. Capillary refill normal to all digits.  No cyanosis or clubbing noted. Pedal hair growth normal.  Neurologic Normal speech. Oriented to person, place, and time. Epicritic sensation to light touch grossly present bilaterally.  Dermatologic Nails well groomed and normal in appearance. No open wounds. No skin lesions.  Orthopedic:  No pain on palpation to the Lisfranc interval.  No pain with range of motion of the metatarsophalangeal joint.  No pain with any of the midtarsal joints range of motion's.   Radiographs: 3 views of skeletally mature adult right foot: Please no signs of fracture noted.  Mild osteoarthritic changes noted to the top of the foot.  Posterior heel spurring noted.  No other bony abnormalities identified. Assessment:   1. Right foot pain    Plan:  Patient was evaluated and treated and all questions answered.  Right dorsal foot contusion without underlying fracture -I explained the patient etiology of contusion versus and options were discussed.  Given that clinically patient does not have any pain and x-rays were  negative for radiographs I believe she does not need any kind of immobilization.  At this time I have asked the patient to return to normal activity without any reservation.  If her foot ever bothers her in the future come back and see me.  She states understanding.  No follow-ups on file.

## 2020-12-21 ENCOUNTER — Other Ambulatory Visit: Payer: Self-pay

## 2020-12-21 ENCOUNTER — Encounter: Payer: Self-pay | Admitting: Podiatry

## 2020-12-21 ENCOUNTER — Ambulatory Visit (INDEPENDENT_AMBULATORY_CARE_PROVIDER_SITE_OTHER): Payer: Medicare Other | Admitting: Podiatry

## 2020-12-21 DIAGNOSIS — M79676 Pain in unspecified toe(s): Secondary | ICD-10-CM

## 2020-12-21 DIAGNOSIS — B351 Tinea unguium: Secondary | ICD-10-CM

## 2020-12-21 NOTE — Patient Instructions (Signed)

## 2020-12-21 NOTE — Progress Notes (Signed)
Subjective: Kristina Donovan is a pleasant 75 y.o. female patient seen today painful thick toenails that are difficult to trim. Pain interferes with ambulation. Aggravating factors include wearing enclosed shoe gear. Pain is relieved with periodic professional debridement.  She relates her right great toe is always more symptomatic due to thickness. She has compounded topical antifungal from [place], but states she doesn't use it as much as she should. She is requesting new Rx for it on today.  PCP is Lavone Orn, MD. Last visit was: 09/11/2020.  Allergies  Allergen Reactions  . Shellfish Allergy Anaphylaxis  . Shellfish-Derived Products Anaphylaxis  . Cetirizine Swelling  . Fluzone Quadrivalent [Influenza Vac Split Quad]     Other reaction(s): increased MS sx  . Fluzone [Influenza Virus Vaccine]     Increased MS  . Guaifenesin     Other reaction(s): Other (See Comments)  . Hemophilus B Polysaccharide Vaccine     Other reaction(s): Other (See Comments) Increased MS  . Nitrofuran Derivatives Diarrhea  . Sulfa Antibiotics Hives    Objective: Physical Exam  General: Kristina Donovan is a pleasant 75 y.o. Caucasian female, WD, WN in NAD. AAO x 3.   Vascular:  Capillary refill time to digits immediate b/l. Palpable pedal pulses b/l LE. Pedal hair present. Lower extremity skin temperature gradient within normal limits. No pain with calf compression b/l.  Dermatological:  Pedal skin with normal turgor, texture and tone bilaterally. No open wounds bilaterally. No interdigital macerations bilaterally. Toenails 1-5 right and L hallux elongated, discolored, dystrophic, thickened, and crumbly with subungual debris and tenderness to dorsal palpation.  Musculoskeletal:  Normal muscle strength 5/5 to all lower extremity muscle groups bilaterally. No pain crepitus or joint limitation noted with ROM b/l. No gross bony deformities bilaterally.  Neurological:   Protective sensation intact 5/5 intact bilaterally with 10g monofilament b/l. Vibratory sensation intact b/l. Clonus negative b/l.  Assessment and Plan:  1. Pain due to onychomycosis of toenail     -Examined patient. -Patient to continue soft, supportive shoe gear daily. -Discussed topical, laser and oral medication. Patient opted for laser therapy. Discussed cost and treatment schedule. Patient will schedule first session at his/her convenience. -Patient instructed to continue topical antifungal solution to toenails once daily from [place]. New Rx sent to [facility] on today. -Toenails 1-5 b/l were debrided in length and girth with sterile nail nippers and dremel without iatrogenic bleeding.  -Patient to report any pedal injuries to medical professional immediately. -Patient/POA to call should there be question/concern in the interim.  Return in about 3 months (around 03/23/2021).  Marzetta Board, DPM

## 2020-12-25 DIAGNOSIS — E7800 Pure hypercholesterolemia, unspecified: Secondary | ICD-10-CM | POA: Diagnosis not present

## 2020-12-25 DIAGNOSIS — M810 Age-related osteoporosis without current pathological fracture: Secondary | ICD-10-CM | POA: Diagnosis not present

## 2020-12-25 DIAGNOSIS — D510 Vitamin B12 deficiency anemia due to intrinsic factor deficiency: Secondary | ICD-10-CM | POA: Diagnosis not present

## 2020-12-25 DIAGNOSIS — N1831 Chronic kidney disease, stage 3a: Secondary | ICD-10-CM | POA: Diagnosis not present

## 2020-12-25 DIAGNOSIS — I10 Essential (primary) hypertension: Secondary | ICD-10-CM | POA: Diagnosis not present

## 2021-01-18 ENCOUNTER — Other Ambulatory Visit: Payer: Medicare Other

## 2021-02-01 ENCOUNTER — Ambulatory Visit (INDEPENDENT_AMBULATORY_CARE_PROVIDER_SITE_OTHER): Payer: Medicare Other

## 2021-02-01 ENCOUNTER — Other Ambulatory Visit: Payer: Self-pay

## 2021-02-01 DIAGNOSIS — B351 Tinea unguium: Secondary | ICD-10-CM

## 2021-02-01 DIAGNOSIS — M79676 Pain in unspecified toe(s): Secondary | ICD-10-CM

## 2021-02-01 NOTE — Progress Notes (Signed)
Patient presents today for the 1st laser treatment. Diagnosed with mycotic nail infection by Dr. Elisha Ponder.   Toenail most affected Right hallux.   All other systems are negative.  Nails were filed thin. Laser therapy was administered to right 1-5 toenails  and patient tolerated the treatment well. All safety precautions were in place.    Follow up in 4 weeks for laser # 2.  Picture of nails taken today to document visual progress

## 2021-02-01 NOTE — Patient Instructions (Signed)

## 2021-02-13 DIAGNOSIS — F0390 Unspecified dementia without behavioral disturbance: Secondary | ICD-10-CM | POA: Diagnosis not present

## 2021-02-13 DIAGNOSIS — N319 Neuromuscular dysfunction of bladder, unspecified: Secondary | ICD-10-CM | POA: Diagnosis not present

## 2021-02-13 DIAGNOSIS — N3281 Overactive bladder: Secondary | ICD-10-CM | POA: Diagnosis not present

## 2021-02-13 DIAGNOSIS — G301 Alzheimer's disease with late onset: Secondary | ICD-10-CM | POA: Diagnosis not present

## 2021-02-13 DIAGNOSIS — N1831 Chronic kidney disease, stage 3a: Secondary | ICD-10-CM | POA: Diagnosis not present

## 2021-02-13 DIAGNOSIS — I10 Essential (primary) hypertension: Secondary | ICD-10-CM | POA: Diagnosis not present

## 2021-02-13 DIAGNOSIS — R197 Diarrhea, unspecified: Secondary | ICD-10-CM | POA: Diagnosis not present

## 2021-02-13 DIAGNOSIS — E538 Deficiency of other specified B group vitamins: Secondary | ICD-10-CM | POA: Diagnosis not present

## 2021-02-20 DIAGNOSIS — N179 Acute kidney failure, unspecified: Secondary | ICD-10-CM | POA: Diagnosis not present

## 2021-03-08 ENCOUNTER — Ambulatory Visit (INDEPENDENT_AMBULATORY_CARE_PROVIDER_SITE_OTHER): Payer: Self-pay | Admitting: *Deleted

## 2021-03-08 ENCOUNTER — Other Ambulatory Visit: Payer: Self-pay

## 2021-03-08 DIAGNOSIS — M79676 Pain in unspecified toe(s): Secondary | ICD-10-CM

## 2021-03-08 DIAGNOSIS — B351 Tinea unguium: Secondary | ICD-10-CM

## 2021-03-08 NOTE — Progress Notes (Signed)
Patient presents today for the 2nd laser treatment. Diagnosed with mycotic nail infection by Dr. Elisha Ponder.   Toenail most affected hallux right, but all the nails on the right foot are somewhat thick and discolored. No change as of yet.  All other systems are negative.  Nails were filed thin. Laser therapy was administered to 1-5 toenails right and patient tolerated the treatment well. All safety precautions were in place.    Follow up in 4 weeks for laser # 3   Picture of nails taken today to document visual progress

## 2021-03-11 DIAGNOSIS — D0430 Carcinoma in situ of skin of unspecified part of face: Secondary | ICD-10-CM | POA: Diagnosis not present

## 2021-03-11 DIAGNOSIS — L720 Epidermal cyst: Secondary | ICD-10-CM | POA: Diagnosis not present

## 2021-04-01 ENCOUNTER — Ambulatory Visit: Payer: Medicare Other | Admitting: Podiatry

## 2021-04-05 ENCOUNTER — Other Ambulatory Visit: Payer: Self-pay

## 2021-04-05 ENCOUNTER — Ambulatory Visit (INDEPENDENT_AMBULATORY_CARE_PROVIDER_SITE_OTHER): Payer: Self-pay

## 2021-04-05 DIAGNOSIS — M79676 Pain in unspecified toe(s): Secondary | ICD-10-CM

## 2021-04-05 DIAGNOSIS — B351 Tinea unguium: Secondary | ICD-10-CM

## 2021-04-05 NOTE — Progress Notes (Signed)
Patient presents today for the 3rd laser treatment. Diagnosed with mycotic nail infection by Dr. Elisha Ponder.   Toenail most affected hallux right, but all the nails on the right foot are somewhat thick and discolored. No change as of yet.  All other systems are negative.  Nails were filed thin. Laser therapy was administered to 1-5 toenails right and patient tolerated the treatment well. All safety precautions were in place.    Follow up in 6 weeks for laser # 4

## 2021-04-08 DIAGNOSIS — N1831 Chronic kidney disease, stage 3a: Secondary | ICD-10-CM | POA: Diagnosis not present

## 2021-04-08 DIAGNOSIS — E7800 Pure hypercholesterolemia, unspecified: Secondary | ICD-10-CM | POA: Diagnosis not present

## 2021-04-08 DIAGNOSIS — F0280 Dementia in other diseases classified elsewhere without behavioral disturbance: Secondary | ICD-10-CM | POA: Diagnosis not present

## 2021-04-08 DIAGNOSIS — G301 Alzheimer's disease with late onset: Secondary | ICD-10-CM | POA: Diagnosis not present

## 2021-04-08 DIAGNOSIS — I10 Essential (primary) hypertension: Secondary | ICD-10-CM | POA: Diagnosis not present

## 2021-04-08 DIAGNOSIS — M810 Age-related osteoporosis without current pathological fracture: Secondary | ICD-10-CM | POA: Diagnosis not present

## 2021-04-08 DIAGNOSIS — D510 Vitamin B12 deficiency anemia due to intrinsic factor deficiency: Secondary | ICD-10-CM | POA: Diagnosis not present

## 2021-04-08 DIAGNOSIS — B351 Tinea unguium: Secondary | ICD-10-CM

## 2021-04-18 DIAGNOSIS — R413 Other amnesia: Secondary | ICD-10-CM | POA: Diagnosis not present

## 2021-04-18 DIAGNOSIS — E041 Nontoxic single thyroid nodule: Secondary | ICD-10-CM | POA: Diagnosis not present

## 2021-04-18 DIAGNOSIS — E210 Primary hyperparathyroidism: Secondary | ICD-10-CM | POA: Diagnosis not present

## 2021-05-17 ENCOUNTER — Ambulatory Visit (INDEPENDENT_AMBULATORY_CARE_PROVIDER_SITE_OTHER): Payer: Self-pay | Admitting: *Deleted

## 2021-05-17 ENCOUNTER — Other Ambulatory Visit: Payer: Self-pay

## 2021-05-17 DIAGNOSIS — B351 Tinea unguium: Secondary | ICD-10-CM

## 2021-05-17 DIAGNOSIS — M79676 Pain in unspecified toe(s): Secondary | ICD-10-CM

## 2021-05-17 NOTE — Progress Notes (Signed)
Patient presents today for the 4th laser treatment. Diagnosed with mycotic nail infection by Dr. Elisha Ponder.   Toenail most affected hallux right, but all the nails on the right foot are somewhat thick and discolored. Starting to see a little new nail growth.  All other systems are negative.  Nails were filed thin. Laser therapy was administered to 1-5 toenails right and patient tolerated the treatment well. All safety precautions were in place.    Follow up in 6 weeks for laser # 5

## 2021-05-31 DIAGNOSIS — R197 Diarrhea, unspecified: Secondary | ICD-10-CM | POA: Diagnosis not present

## 2021-05-31 DIAGNOSIS — I10 Essential (primary) hypertension: Secondary | ICD-10-CM | POA: Diagnosis not present

## 2021-05-31 DIAGNOSIS — G301 Alzheimer's disease with late onset: Secondary | ICD-10-CM | POA: Diagnosis not present

## 2021-05-31 DIAGNOSIS — R159 Full incontinence of feces: Secondary | ICD-10-CM | POA: Diagnosis not present

## 2021-05-31 DIAGNOSIS — N183 Chronic kidney disease, stage 3 unspecified: Secondary | ICD-10-CM | POA: Diagnosis not present

## 2021-05-31 DIAGNOSIS — K589 Irritable bowel syndrome without diarrhea: Secondary | ICD-10-CM | POA: Diagnosis not present

## 2021-05-31 DIAGNOSIS — F02A0 Dementia in other diseases classified elsewhere, mild, without behavioral disturbance, psychotic disturbance, mood disturbance, and anxiety: Secondary | ICD-10-CM | POA: Diagnosis not present

## 2021-05-31 DIAGNOSIS — Z79899 Other long term (current) drug therapy: Secondary | ICD-10-CM | POA: Diagnosis not present

## 2021-05-31 DIAGNOSIS — Z23 Encounter for immunization: Secondary | ICD-10-CM | POA: Diagnosis not present

## 2021-06-21 DIAGNOSIS — T17308A Unspecified foreign body in larynx causing other injury, initial encounter: Secondary | ICD-10-CM | POA: Diagnosis not present

## 2021-06-21 DIAGNOSIS — R413 Other amnesia: Secondary | ICD-10-CM | POA: Diagnosis not present

## 2021-06-21 DIAGNOSIS — I10 Essential (primary) hypertension: Secondary | ICD-10-CM | POA: Diagnosis not present

## 2021-06-21 DIAGNOSIS — R197 Diarrhea, unspecified: Secondary | ICD-10-CM | POA: Diagnosis not present

## 2021-06-21 DIAGNOSIS — N183 Chronic kidney disease, stage 3 unspecified: Secondary | ICD-10-CM | POA: Diagnosis not present

## 2021-06-28 ENCOUNTER — Other Ambulatory Visit: Payer: Medicare Other

## 2021-07-25 DIAGNOSIS — I739 Peripheral vascular disease, unspecified: Secondary | ICD-10-CM | POA: Diagnosis not present

## 2021-07-25 DIAGNOSIS — G309 Alzheimer's disease, unspecified: Secondary | ICD-10-CM | POA: Diagnosis not present

## 2021-07-25 DIAGNOSIS — I447 Left bundle-branch block, unspecified: Secondary | ICD-10-CM | POA: Diagnosis not present

## 2021-07-25 DIAGNOSIS — E7800 Pure hypercholesterolemia, unspecified: Secondary | ICD-10-CM | POA: Diagnosis not present

## 2021-07-25 DIAGNOSIS — I700 Atherosclerosis of aorta: Secondary | ICD-10-CM | POA: Diagnosis not present

## 2021-07-25 DIAGNOSIS — I130 Hypertensive heart and chronic kidney disease with heart failure and stage 1 through stage 4 chronic kidney disease, or unspecified chronic kidney disease: Secondary | ICD-10-CM | POA: Diagnosis not present

## 2021-07-25 DIAGNOSIS — D510 Vitamin B12 deficiency anemia due to intrinsic factor deficiency: Secondary | ICD-10-CM | POA: Diagnosis not present

## 2021-07-25 DIAGNOSIS — F02A0 Dementia in other diseases classified elsewhere, mild, without behavioral disturbance, psychotic disturbance, mood disturbance, and anxiety: Secondary | ICD-10-CM | POA: Diagnosis not present

## 2021-07-25 DIAGNOSIS — K5090 Crohn's disease, unspecified, without complications: Secondary | ICD-10-CM | POA: Diagnosis not present

## 2021-07-25 DIAGNOSIS — I6523 Occlusion and stenosis of bilateral carotid arteries: Secondary | ICD-10-CM | POA: Diagnosis not present

## 2021-07-25 DIAGNOSIS — N200 Calculus of kidney: Secondary | ICD-10-CM | POA: Diagnosis not present

## 2021-07-25 DIAGNOSIS — M810 Age-related osteoporosis without current pathological fracture: Secondary | ICD-10-CM | POA: Diagnosis not present

## 2021-07-25 DIAGNOSIS — G35 Multiple sclerosis: Secondary | ICD-10-CM | POA: Diagnosis not present

## 2021-07-25 DIAGNOSIS — N1831 Chronic kidney disease, stage 3a: Secondary | ICD-10-CM | POA: Diagnosis not present

## 2021-07-25 DIAGNOSIS — I5032 Chronic diastolic (congestive) heart failure: Secondary | ICD-10-CM | POA: Diagnosis not present

## 2021-07-26 ENCOUNTER — Ambulatory Visit (INDEPENDENT_AMBULATORY_CARE_PROVIDER_SITE_OTHER): Payer: Self-pay | Admitting: *Deleted

## 2021-07-26 ENCOUNTER — Other Ambulatory Visit: Payer: Self-pay

## 2021-07-26 DIAGNOSIS — M79676 Pain in unspecified toe(s): Secondary | ICD-10-CM

## 2021-07-26 DIAGNOSIS — B351 Tinea unguium: Secondary | ICD-10-CM

## 2021-07-26 NOTE — Progress Notes (Signed)
Patient presents today for the 5th laser treatment. Diagnosed with mycotic nail infection by Dr. Elisha Ponder.   Toenail most affected hallux right, but all the nails on the right foot are somewhat thick and discolored. Starting to see a little new nail growth.  All other systems are negative.  Nails were filed thin. Laser therapy was administered to 1-5 toenails right and patient tolerated the treatment well. All safety precautions were in place.    Follow up in 8 weeks for laser # 6.

## 2021-07-29 DIAGNOSIS — G35 Multiple sclerosis: Secondary | ICD-10-CM | POA: Diagnosis not present

## 2021-07-29 DIAGNOSIS — I5032 Chronic diastolic (congestive) heart failure: Secondary | ICD-10-CM | POA: Diagnosis not present

## 2021-07-29 DIAGNOSIS — G309 Alzheimer's disease, unspecified: Secondary | ICD-10-CM | POA: Diagnosis not present

## 2021-07-29 DIAGNOSIS — F02A0 Dementia in other diseases classified elsewhere, mild, without behavioral disturbance, psychotic disturbance, mood disturbance, and anxiety: Secondary | ICD-10-CM | POA: Diagnosis not present

## 2021-07-29 DIAGNOSIS — I130 Hypertensive heart and chronic kidney disease with heart failure and stage 1 through stage 4 chronic kidney disease, or unspecified chronic kidney disease: Secondary | ICD-10-CM | POA: Diagnosis not present

## 2021-07-29 DIAGNOSIS — N1831 Chronic kidney disease, stage 3a: Secondary | ICD-10-CM | POA: Diagnosis not present

## 2021-07-31 DIAGNOSIS — I5032 Chronic diastolic (congestive) heart failure: Secondary | ICD-10-CM | POA: Diagnosis not present

## 2021-07-31 DIAGNOSIS — F02A0 Dementia in other diseases classified elsewhere, mild, without behavioral disturbance, psychotic disturbance, mood disturbance, and anxiety: Secondary | ICD-10-CM | POA: Diagnosis not present

## 2021-07-31 DIAGNOSIS — G35 Multiple sclerosis: Secondary | ICD-10-CM | POA: Diagnosis not present

## 2021-07-31 DIAGNOSIS — I130 Hypertensive heart and chronic kidney disease with heart failure and stage 1 through stage 4 chronic kidney disease, or unspecified chronic kidney disease: Secondary | ICD-10-CM | POA: Diagnosis not present

## 2021-07-31 DIAGNOSIS — N1831 Chronic kidney disease, stage 3a: Secondary | ICD-10-CM | POA: Diagnosis not present

## 2021-07-31 DIAGNOSIS — G309 Alzheimer's disease, unspecified: Secondary | ICD-10-CM | POA: Diagnosis not present

## 2021-08-05 DIAGNOSIS — I130 Hypertensive heart and chronic kidney disease with heart failure and stage 1 through stage 4 chronic kidney disease, or unspecified chronic kidney disease: Secondary | ICD-10-CM | POA: Diagnosis not present

## 2021-08-05 DIAGNOSIS — N1831 Chronic kidney disease, stage 3a: Secondary | ICD-10-CM | POA: Diagnosis not present

## 2021-08-05 DIAGNOSIS — F02A0 Dementia in other diseases classified elsewhere, mild, without behavioral disturbance, psychotic disturbance, mood disturbance, and anxiety: Secondary | ICD-10-CM | POA: Diagnosis not present

## 2021-08-05 DIAGNOSIS — I5032 Chronic diastolic (congestive) heart failure: Secondary | ICD-10-CM | POA: Diagnosis not present

## 2021-08-05 DIAGNOSIS — G309 Alzheimer's disease, unspecified: Secondary | ICD-10-CM | POA: Diagnosis not present

## 2021-08-05 DIAGNOSIS — G35 Multiple sclerosis: Secondary | ICD-10-CM | POA: Diagnosis not present

## 2021-08-06 DIAGNOSIS — F329 Major depressive disorder, single episode, unspecified: Secondary | ICD-10-CM | POA: Diagnosis not present

## 2021-08-06 DIAGNOSIS — R32 Unspecified urinary incontinence: Secondary | ICD-10-CM | POA: Diagnosis not present

## 2021-08-06 DIAGNOSIS — R030 Elevated blood-pressure reading, without diagnosis of hypertension: Secondary | ICD-10-CM | POA: Diagnosis not present

## 2021-08-06 DIAGNOSIS — H5789 Other specified disorders of eye and adnexa: Secondary | ICD-10-CM | POA: Diagnosis not present

## 2021-08-07 DIAGNOSIS — N1831 Chronic kidney disease, stage 3a: Secondary | ICD-10-CM | POA: Diagnosis not present

## 2021-08-07 DIAGNOSIS — F02A0 Dementia in other diseases classified elsewhere, mild, without behavioral disturbance, psychotic disturbance, mood disturbance, and anxiety: Secondary | ICD-10-CM | POA: Diagnosis not present

## 2021-08-07 DIAGNOSIS — I130 Hypertensive heart and chronic kidney disease with heart failure and stage 1 through stage 4 chronic kidney disease, or unspecified chronic kidney disease: Secondary | ICD-10-CM | POA: Diagnosis not present

## 2021-08-07 DIAGNOSIS — G309 Alzheimer's disease, unspecified: Secondary | ICD-10-CM | POA: Diagnosis not present

## 2021-08-07 DIAGNOSIS — G35 Multiple sclerosis: Secondary | ICD-10-CM | POA: Diagnosis not present

## 2021-08-07 DIAGNOSIS — I5032 Chronic diastolic (congestive) heart failure: Secondary | ICD-10-CM | POA: Diagnosis not present

## 2021-08-08 DIAGNOSIS — G309 Alzheimer's disease, unspecified: Secondary | ICD-10-CM | POA: Diagnosis not present

## 2021-08-08 DIAGNOSIS — G35 Multiple sclerosis: Secondary | ICD-10-CM | POA: Diagnosis not present

## 2021-08-08 DIAGNOSIS — I5032 Chronic diastolic (congestive) heart failure: Secondary | ICD-10-CM | POA: Diagnosis not present

## 2021-08-08 DIAGNOSIS — F02A0 Dementia in other diseases classified elsewhere, mild, without behavioral disturbance, psychotic disturbance, mood disturbance, and anxiety: Secondary | ICD-10-CM | POA: Diagnosis not present

## 2021-08-08 DIAGNOSIS — N1831 Chronic kidney disease, stage 3a: Secondary | ICD-10-CM | POA: Diagnosis not present

## 2021-08-08 DIAGNOSIS — I130 Hypertensive heart and chronic kidney disease with heart failure and stage 1 through stage 4 chronic kidney disease, or unspecified chronic kidney disease: Secondary | ICD-10-CM | POA: Diagnosis not present

## 2021-08-09 DIAGNOSIS — I5032 Chronic diastolic (congestive) heart failure: Secondary | ICD-10-CM | POA: Diagnosis not present

## 2021-08-09 DIAGNOSIS — G35 Multiple sclerosis: Secondary | ICD-10-CM | POA: Diagnosis not present

## 2021-08-09 DIAGNOSIS — I130 Hypertensive heart and chronic kidney disease with heart failure and stage 1 through stage 4 chronic kidney disease, or unspecified chronic kidney disease: Secondary | ICD-10-CM | POA: Diagnosis not present

## 2021-08-09 DIAGNOSIS — F02A0 Dementia in other diseases classified elsewhere, mild, without behavioral disturbance, psychotic disturbance, mood disturbance, and anxiety: Secondary | ICD-10-CM | POA: Diagnosis not present

## 2021-08-09 DIAGNOSIS — N1831 Chronic kidney disease, stage 3a: Secondary | ICD-10-CM | POA: Diagnosis not present

## 2021-08-09 DIAGNOSIS — G309 Alzheimer's disease, unspecified: Secondary | ICD-10-CM | POA: Diagnosis not present

## 2021-08-13 DIAGNOSIS — I130 Hypertensive heart and chronic kidney disease with heart failure and stage 1 through stage 4 chronic kidney disease, or unspecified chronic kidney disease: Secondary | ICD-10-CM | POA: Diagnosis not present

## 2021-08-13 DIAGNOSIS — I5032 Chronic diastolic (congestive) heart failure: Secondary | ICD-10-CM | POA: Diagnosis not present

## 2021-08-13 DIAGNOSIS — F02A0 Dementia in other diseases classified elsewhere, mild, without behavioral disturbance, psychotic disturbance, mood disturbance, and anxiety: Secondary | ICD-10-CM | POA: Diagnosis not present

## 2021-08-13 DIAGNOSIS — N1831 Chronic kidney disease, stage 3a: Secondary | ICD-10-CM | POA: Diagnosis not present

## 2021-08-13 DIAGNOSIS — G35 Multiple sclerosis: Secondary | ICD-10-CM | POA: Diagnosis not present

## 2021-08-13 DIAGNOSIS — G309 Alzheimer's disease, unspecified: Secondary | ICD-10-CM | POA: Diagnosis not present

## 2021-08-14 DIAGNOSIS — F02A0 Dementia in other diseases classified elsewhere, mild, without behavioral disturbance, psychotic disturbance, mood disturbance, and anxiety: Secondary | ICD-10-CM | POA: Diagnosis not present

## 2021-08-14 DIAGNOSIS — G35 Multiple sclerosis: Secondary | ICD-10-CM | POA: Diagnosis not present

## 2021-08-14 DIAGNOSIS — N1831 Chronic kidney disease, stage 3a: Secondary | ICD-10-CM | POA: Diagnosis not present

## 2021-08-14 DIAGNOSIS — G309 Alzheimer's disease, unspecified: Secondary | ICD-10-CM | POA: Diagnosis not present

## 2021-08-14 DIAGNOSIS — I130 Hypertensive heart and chronic kidney disease with heart failure and stage 1 through stage 4 chronic kidney disease, or unspecified chronic kidney disease: Secondary | ICD-10-CM | POA: Diagnosis not present

## 2021-08-14 DIAGNOSIS — I5032 Chronic diastolic (congestive) heart failure: Secondary | ICD-10-CM | POA: Diagnosis not present

## 2021-08-15 DIAGNOSIS — I130 Hypertensive heart and chronic kidney disease with heart failure and stage 1 through stage 4 chronic kidney disease, or unspecified chronic kidney disease: Secondary | ICD-10-CM | POA: Diagnosis not present

## 2021-08-15 DIAGNOSIS — G35 Multiple sclerosis: Secondary | ICD-10-CM | POA: Diagnosis not present

## 2021-08-15 DIAGNOSIS — I5032 Chronic diastolic (congestive) heart failure: Secondary | ICD-10-CM | POA: Diagnosis not present

## 2021-08-15 DIAGNOSIS — G309 Alzheimer's disease, unspecified: Secondary | ICD-10-CM | POA: Diagnosis not present

## 2021-08-15 DIAGNOSIS — N1831 Chronic kidney disease, stage 3a: Secondary | ICD-10-CM | POA: Diagnosis not present

## 2021-08-15 DIAGNOSIS — F02A0 Dementia in other diseases classified elsewhere, mild, without behavioral disturbance, psychotic disturbance, mood disturbance, and anxiety: Secondary | ICD-10-CM | POA: Diagnosis not present

## 2021-08-16 DIAGNOSIS — F02A0 Dementia in other diseases classified elsewhere, mild, without behavioral disturbance, psychotic disturbance, mood disturbance, and anxiety: Secondary | ICD-10-CM | POA: Diagnosis not present

## 2021-08-16 DIAGNOSIS — I130 Hypertensive heart and chronic kidney disease with heart failure and stage 1 through stage 4 chronic kidney disease, or unspecified chronic kidney disease: Secondary | ICD-10-CM | POA: Diagnosis not present

## 2021-08-16 DIAGNOSIS — G309 Alzheimer's disease, unspecified: Secondary | ICD-10-CM | POA: Diagnosis not present

## 2021-08-16 DIAGNOSIS — N1831 Chronic kidney disease, stage 3a: Secondary | ICD-10-CM | POA: Diagnosis not present

## 2021-08-16 DIAGNOSIS — I5032 Chronic diastolic (congestive) heart failure: Secondary | ICD-10-CM | POA: Diagnosis not present

## 2021-08-16 DIAGNOSIS — G35 Multiple sclerosis: Secondary | ICD-10-CM | POA: Diagnosis not present

## 2021-08-19 DIAGNOSIS — G35 Multiple sclerosis: Secondary | ICD-10-CM | POA: Diagnosis not present

## 2021-08-19 DIAGNOSIS — N1831 Chronic kidney disease, stage 3a: Secondary | ICD-10-CM | POA: Diagnosis not present

## 2021-08-19 DIAGNOSIS — I130 Hypertensive heart and chronic kidney disease with heart failure and stage 1 through stage 4 chronic kidney disease, or unspecified chronic kidney disease: Secondary | ICD-10-CM | POA: Diagnosis not present

## 2021-08-19 DIAGNOSIS — F02A0 Dementia in other diseases classified elsewhere, mild, without behavioral disturbance, psychotic disturbance, mood disturbance, and anxiety: Secondary | ICD-10-CM | POA: Diagnosis not present

## 2021-08-19 DIAGNOSIS — I5032 Chronic diastolic (congestive) heart failure: Secondary | ICD-10-CM | POA: Diagnosis not present

## 2021-08-19 DIAGNOSIS — G309 Alzheimer's disease, unspecified: Secondary | ICD-10-CM | POA: Diagnosis not present

## 2021-08-21 DIAGNOSIS — N1831 Chronic kidney disease, stage 3a: Secondary | ICD-10-CM | POA: Diagnosis not present

## 2021-08-21 DIAGNOSIS — M810 Age-related osteoporosis without current pathological fracture: Secondary | ICD-10-CM | POA: Diagnosis not present

## 2021-08-21 DIAGNOSIS — I1310 Hypertensive heart and chronic kidney disease without heart failure, with stage 1 through stage 4 chronic kidney disease, or unspecified chronic kidney disease: Secondary | ICD-10-CM | POA: Diagnosis not present

## 2021-08-21 DIAGNOSIS — R159 Full incontinence of feces: Secondary | ICD-10-CM | POA: Diagnosis not present

## 2021-08-21 DIAGNOSIS — I447 Left bundle-branch block, unspecified: Secondary | ICD-10-CM | POA: Diagnosis not present

## 2021-08-21 DIAGNOSIS — Z87442 Personal history of urinary calculi: Secondary | ICD-10-CM | POA: Diagnosis not present

## 2021-08-21 DIAGNOSIS — I700 Atherosclerosis of aorta: Secondary | ICD-10-CM | POA: Diagnosis not present

## 2021-08-21 DIAGNOSIS — D510 Vitamin B12 deficiency anemia due to intrinsic factor deficiency: Secondary | ICD-10-CM | POA: Diagnosis not present

## 2021-08-21 DIAGNOSIS — M40209 Unspecified kyphosis, site unspecified: Secondary | ICD-10-CM | POA: Diagnosis not present

## 2021-08-21 DIAGNOSIS — K589 Irritable bowel syndrome without diarrhea: Secondary | ICD-10-CM | POA: Diagnosis not present

## 2021-08-21 DIAGNOSIS — E041 Nontoxic single thyroid nodule: Secondary | ICD-10-CM | POA: Diagnosis not present

## 2021-08-21 DIAGNOSIS — R911 Solitary pulmonary nodule: Secondary | ICD-10-CM | POA: Diagnosis not present

## 2021-08-21 DIAGNOSIS — E7800 Pure hypercholesterolemia, unspecified: Secondary | ICD-10-CM | POA: Diagnosis not present

## 2021-08-21 DIAGNOSIS — F02A0 Dementia in other diseases classified elsewhere, mild, without behavioral disturbance, psychotic disturbance, mood disturbance, and anxiety: Secondary | ICD-10-CM | POA: Diagnosis not present

## 2021-08-21 DIAGNOSIS — I051 Rheumatic mitral insufficiency: Secondary | ICD-10-CM | POA: Diagnosis not present

## 2021-08-21 DIAGNOSIS — G301 Alzheimer's disease with late onset: Secondary | ICD-10-CM | POA: Diagnosis not present

## 2021-08-21 DIAGNOSIS — J3089 Other allergic rhinitis: Secondary | ICD-10-CM | POA: Diagnosis not present

## 2021-08-21 DIAGNOSIS — N3946 Mixed incontinence: Secondary | ICD-10-CM | POA: Diagnosis not present

## 2021-08-21 DIAGNOSIS — G35 Multiple sclerosis: Secondary | ICD-10-CM | POA: Diagnosis not present

## 2021-08-28 DIAGNOSIS — I700 Atherosclerosis of aorta: Secondary | ICD-10-CM | POA: Diagnosis not present

## 2021-08-28 DIAGNOSIS — M40209 Unspecified kyphosis, site unspecified: Secondary | ICD-10-CM | POA: Diagnosis not present

## 2021-08-28 DIAGNOSIS — I447 Left bundle-branch block, unspecified: Secondary | ICD-10-CM | POA: Diagnosis not present

## 2021-08-28 DIAGNOSIS — I051 Rheumatic mitral insufficiency: Secondary | ICD-10-CM | POA: Diagnosis not present

## 2021-08-28 DIAGNOSIS — N1831 Chronic kidney disease, stage 3a: Secondary | ICD-10-CM | POA: Diagnosis not present

## 2021-08-28 DIAGNOSIS — I1310 Hypertensive heart and chronic kidney disease without heart failure, with stage 1 through stage 4 chronic kidney disease, or unspecified chronic kidney disease: Secondary | ICD-10-CM | POA: Diagnosis not present

## 2021-08-30 DIAGNOSIS — I700 Atherosclerosis of aorta: Secondary | ICD-10-CM | POA: Diagnosis not present

## 2021-08-30 DIAGNOSIS — I1310 Hypertensive heart and chronic kidney disease without heart failure, with stage 1 through stage 4 chronic kidney disease, or unspecified chronic kidney disease: Secondary | ICD-10-CM | POA: Diagnosis not present

## 2021-08-30 DIAGNOSIS — N1831 Chronic kidney disease, stage 3a: Secondary | ICD-10-CM | POA: Diagnosis not present

## 2021-08-30 DIAGNOSIS — I051 Rheumatic mitral insufficiency: Secondary | ICD-10-CM | POA: Diagnosis not present

## 2021-08-30 DIAGNOSIS — M40209 Unspecified kyphosis, site unspecified: Secondary | ICD-10-CM | POA: Diagnosis not present

## 2021-08-30 DIAGNOSIS — I447 Left bundle-branch block, unspecified: Secondary | ICD-10-CM | POA: Diagnosis not present

## 2021-09-03 DIAGNOSIS — G35 Multiple sclerosis: Secondary | ICD-10-CM | POA: Diagnosis not present

## 2021-09-03 DIAGNOSIS — H532 Diplopia: Secondary | ICD-10-CM | POA: Diagnosis not present

## 2021-09-03 DIAGNOSIS — G9389 Other specified disorders of brain: Secondary | ICD-10-CM | POA: Diagnosis not present

## 2021-09-03 DIAGNOSIS — R4189 Other symptoms and signs involving cognitive functions and awareness: Secondary | ICD-10-CM | POA: Diagnosis not present

## 2021-09-03 DIAGNOSIS — R413 Other amnesia: Secondary | ICD-10-CM | POA: Diagnosis not present

## 2021-09-04 DIAGNOSIS — I700 Atherosclerosis of aorta: Secondary | ICD-10-CM | POA: Diagnosis not present

## 2021-09-04 DIAGNOSIS — N1831 Chronic kidney disease, stage 3a: Secondary | ICD-10-CM | POA: Diagnosis not present

## 2021-09-04 DIAGNOSIS — I447 Left bundle-branch block, unspecified: Secondary | ICD-10-CM | POA: Diagnosis not present

## 2021-09-04 DIAGNOSIS — M40209 Unspecified kyphosis, site unspecified: Secondary | ICD-10-CM | POA: Diagnosis not present

## 2021-09-04 DIAGNOSIS — I051 Rheumatic mitral insufficiency: Secondary | ICD-10-CM | POA: Diagnosis not present

## 2021-09-04 DIAGNOSIS — I1310 Hypertensive heart and chronic kidney disease without heart failure, with stage 1 through stage 4 chronic kidney disease, or unspecified chronic kidney disease: Secondary | ICD-10-CM | POA: Diagnosis not present

## 2021-09-05 DIAGNOSIS — N1831 Chronic kidney disease, stage 3a: Secondary | ICD-10-CM | POA: Diagnosis not present

## 2021-09-05 DIAGNOSIS — I700 Atherosclerosis of aorta: Secondary | ICD-10-CM | POA: Diagnosis not present

## 2021-09-05 DIAGNOSIS — I1310 Hypertensive heart and chronic kidney disease without heart failure, with stage 1 through stage 4 chronic kidney disease, or unspecified chronic kidney disease: Secondary | ICD-10-CM | POA: Diagnosis not present

## 2021-09-05 DIAGNOSIS — I051 Rheumatic mitral insufficiency: Secondary | ICD-10-CM | POA: Diagnosis not present

## 2021-09-05 DIAGNOSIS — I447 Left bundle-branch block, unspecified: Secondary | ICD-10-CM | POA: Diagnosis not present

## 2021-09-05 DIAGNOSIS — M40209 Unspecified kyphosis, site unspecified: Secondary | ICD-10-CM | POA: Diagnosis not present

## 2021-09-06 DIAGNOSIS — G319 Degenerative disease of nervous system, unspecified: Secondary | ICD-10-CM | POA: Diagnosis not present

## 2021-09-06 DIAGNOSIS — R9082 White matter disease, unspecified: Secondary | ICD-10-CM | POA: Diagnosis not present

## 2021-09-06 DIAGNOSIS — R413 Other amnesia: Secondary | ICD-10-CM | POA: Diagnosis not present

## 2021-09-09 ENCOUNTER — Other Ambulatory Visit: Payer: Self-pay | Admitting: Internal Medicine

## 2021-09-09 DIAGNOSIS — I1310 Hypertensive heart and chronic kidney disease without heart failure, with stage 1 through stage 4 chronic kidney disease, or unspecified chronic kidney disease: Secondary | ICD-10-CM | POA: Diagnosis not present

## 2021-09-09 DIAGNOSIS — Z1231 Encounter for screening mammogram for malignant neoplasm of breast: Secondary | ICD-10-CM

## 2021-09-09 DIAGNOSIS — N1831 Chronic kidney disease, stage 3a: Secondary | ICD-10-CM | POA: Diagnosis not present

## 2021-09-09 DIAGNOSIS — I700 Atherosclerosis of aorta: Secondary | ICD-10-CM | POA: Diagnosis not present

## 2021-09-09 DIAGNOSIS — I051 Rheumatic mitral insufficiency: Secondary | ICD-10-CM | POA: Diagnosis not present

## 2021-09-09 DIAGNOSIS — M40209 Unspecified kyphosis, site unspecified: Secondary | ICD-10-CM | POA: Diagnosis not present

## 2021-09-09 DIAGNOSIS — I447 Left bundle-branch block, unspecified: Secondary | ICD-10-CM | POA: Diagnosis not present

## 2021-09-11 DIAGNOSIS — I051 Rheumatic mitral insufficiency: Secondary | ICD-10-CM | POA: Diagnosis not present

## 2021-09-11 DIAGNOSIS — M40209 Unspecified kyphosis, site unspecified: Secondary | ICD-10-CM | POA: Diagnosis not present

## 2021-09-11 DIAGNOSIS — I447 Left bundle-branch block, unspecified: Secondary | ICD-10-CM | POA: Diagnosis not present

## 2021-09-11 DIAGNOSIS — I700 Atherosclerosis of aorta: Secondary | ICD-10-CM | POA: Diagnosis not present

## 2021-09-11 DIAGNOSIS — I1310 Hypertensive heart and chronic kidney disease without heart failure, with stage 1 through stage 4 chronic kidney disease, or unspecified chronic kidney disease: Secondary | ICD-10-CM | POA: Diagnosis not present

## 2021-09-11 DIAGNOSIS — N1831 Chronic kidney disease, stage 3a: Secondary | ICD-10-CM | POA: Diagnosis not present

## 2021-09-18 DIAGNOSIS — I700 Atherosclerosis of aorta: Secondary | ICD-10-CM | POA: Diagnosis not present

## 2021-09-18 DIAGNOSIS — I1310 Hypertensive heart and chronic kidney disease without heart failure, with stage 1 through stage 4 chronic kidney disease, or unspecified chronic kidney disease: Secondary | ICD-10-CM | POA: Diagnosis not present

## 2021-09-18 DIAGNOSIS — I447 Left bundle-branch block, unspecified: Secondary | ICD-10-CM | POA: Diagnosis not present

## 2021-09-18 DIAGNOSIS — M40209 Unspecified kyphosis, site unspecified: Secondary | ICD-10-CM | POA: Diagnosis not present

## 2021-09-18 DIAGNOSIS — I051 Rheumatic mitral insufficiency: Secondary | ICD-10-CM | POA: Diagnosis not present

## 2021-09-18 DIAGNOSIS — N1831 Chronic kidney disease, stage 3a: Secondary | ICD-10-CM | POA: Diagnosis not present

## 2021-09-20 ENCOUNTER — Other Ambulatory Visit: Payer: Self-pay

## 2021-09-20 ENCOUNTER — Ambulatory Visit (INDEPENDENT_AMBULATORY_CARE_PROVIDER_SITE_OTHER): Payer: Self-pay

## 2021-09-20 DIAGNOSIS — E041 Nontoxic single thyroid nodule: Secondary | ICD-10-CM | POA: Diagnosis not present

## 2021-09-20 DIAGNOSIS — Z87442 Personal history of urinary calculi: Secondary | ICD-10-CM | POA: Diagnosis not present

## 2021-09-20 DIAGNOSIS — R159 Full incontinence of feces: Secondary | ICD-10-CM | POA: Diagnosis not present

## 2021-09-20 DIAGNOSIS — E7800 Pure hypercholesterolemia, unspecified: Secondary | ICD-10-CM | POA: Diagnosis not present

## 2021-09-20 DIAGNOSIS — N1831 Chronic kidney disease, stage 3a: Secondary | ICD-10-CM | POA: Diagnosis not present

## 2021-09-20 DIAGNOSIS — R911 Solitary pulmonary nodule: Secondary | ICD-10-CM | POA: Diagnosis not present

## 2021-09-20 DIAGNOSIS — B351 Tinea unguium: Secondary | ICD-10-CM

## 2021-09-20 DIAGNOSIS — M40209 Unspecified kyphosis, site unspecified: Secondary | ICD-10-CM | POA: Diagnosis not present

## 2021-09-20 DIAGNOSIS — M810 Age-related osteoporosis without current pathological fracture: Secondary | ICD-10-CM | POA: Diagnosis not present

## 2021-09-20 DIAGNOSIS — D510 Vitamin B12 deficiency anemia due to intrinsic factor deficiency: Secondary | ICD-10-CM | POA: Diagnosis not present

## 2021-09-20 DIAGNOSIS — J3089 Other allergic rhinitis: Secondary | ICD-10-CM | POA: Diagnosis not present

## 2021-09-20 DIAGNOSIS — I700 Atherosclerosis of aorta: Secondary | ICD-10-CM | POA: Diagnosis not present

## 2021-09-20 DIAGNOSIS — G301 Alzheimer's disease with late onset: Secondary | ICD-10-CM | POA: Diagnosis not present

## 2021-09-20 DIAGNOSIS — G35 Multiple sclerosis: Secondary | ICD-10-CM | POA: Diagnosis not present

## 2021-09-20 DIAGNOSIS — I1310 Hypertensive heart and chronic kidney disease without heart failure, with stage 1 through stage 4 chronic kidney disease, or unspecified chronic kidney disease: Secondary | ICD-10-CM | POA: Diagnosis not present

## 2021-09-20 DIAGNOSIS — M79676 Pain in unspecified toe(s): Secondary | ICD-10-CM

## 2021-09-20 DIAGNOSIS — K589 Irritable bowel syndrome without diarrhea: Secondary | ICD-10-CM | POA: Diagnosis not present

## 2021-09-20 DIAGNOSIS — F02A0 Dementia in other diseases classified elsewhere, mild, without behavioral disturbance, psychotic disturbance, mood disturbance, and anxiety: Secondary | ICD-10-CM | POA: Diagnosis not present

## 2021-09-20 DIAGNOSIS — I051 Rheumatic mitral insufficiency: Secondary | ICD-10-CM | POA: Diagnosis not present

## 2021-09-20 DIAGNOSIS — I447 Left bundle-branch block, unspecified: Secondary | ICD-10-CM | POA: Diagnosis not present

## 2021-09-20 DIAGNOSIS — N3946 Mixed incontinence: Secondary | ICD-10-CM | POA: Diagnosis not present

## 2021-09-20 NOTE — Progress Notes (Signed)
Patient presents today for the 6th laser treatment. Diagnosed with mycotic nail infection by Dr. Elisha Ponder.  ? ?Toenail most affected hallux right, but all the nails on the right foot are somewhat thick and discolored. Starting to see a little new nail growth. ? ?All other systems are negative. ? ?Nails were filed thin. Laser therapy was administered to 1-5 toenails right and patient tolerated the treatment well. All safety precautions were in place.  ? ? ?Patient has completed the recommended laser treatments. He will follow up with Dr. Elisha Ponder in 3 months to evaluate progress.   ? ? ?

## 2021-09-23 DIAGNOSIS — M40209 Unspecified kyphosis, site unspecified: Secondary | ICD-10-CM | POA: Diagnosis not present

## 2021-09-23 DIAGNOSIS — I447 Left bundle-branch block, unspecified: Secondary | ICD-10-CM | POA: Diagnosis not present

## 2021-09-23 DIAGNOSIS — I1310 Hypertensive heart and chronic kidney disease without heart failure, with stage 1 through stage 4 chronic kidney disease, or unspecified chronic kidney disease: Secondary | ICD-10-CM | POA: Diagnosis not present

## 2021-09-23 DIAGNOSIS — I700 Atherosclerosis of aorta: Secondary | ICD-10-CM | POA: Diagnosis not present

## 2021-09-23 DIAGNOSIS — I051 Rheumatic mitral insufficiency: Secondary | ICD-10-CM | POA: Diagnosis not present

## 2021-09-23 DIAGNOSIS — N1831 Chronic kidney disease, stage 3a: Secondary | ICD-10-CM | POA: Diagnosis not present

## 2021-09-25 DIAGNOSIS — N1831 Chronic kidney disease, stage 3a: Secondary | ICD-10-CM | POA: Diagnosis not present

## 2021-09-25 DIAGNOSIS — I447 Left bundle-branch block, unspecified: Secondary | ICD-10-CM | POA: Diagnosis not present

## 2021-09-25 DIAGNOSIS — I051 Rheumatic mitral insufficiency: Secondary | ICD-10-CM | POA: Diagnosis not present

## 2021-09-25 DIAGNOSIS — I700 Atherosclerosis of aorta: Secondary | ICD-10-CM | POA: Diagnosis not present

## 2021-09-25 DIAGNOSIS — M40209 Unspecified kyphosis, site unspecified: Secondary | ICD-10-CM | POA: Diagnosis not present

## 2021-09-25 DIAGNOSIS — I1310 Hypertensive heart and chronic kidney disease without heart failure, with stage 1 through stage 4 chronic kidney disease, or unspecified chronic kidney disease: Secondary | ICD-10-CM | POA: Diagnosis not present

## 2021-09-26 DIAGNOSIS — N1831 Chronic kidney disease, stage 3a: Secondary | ICD-10-CM | POA: Diagnosis not present

## 2021-09-26 DIAGNOSIS — I700 Atherosclerosis of aorta: Secondary | ICD-10-CM | POA: Diagnosis not present

## 2021-09-26 DIAGNOSIS — I447 Left bundle-branch block, unspecified: Secondary | ICD-10-CM | POA: Diagnosis not present

## 2021-09-26 DIAGNOSIS — I051 Rheumatic mitral insufficiency: Secondary | ICD-10-CM | POA: Diagnosis not present

## 2021-09-26 DIAGNOSIS — M40209 Unspecified kyphosis, site unspecified: Secondary | ICD-10-CM | POA: Diagnosis not present

## 2021-09-26 DIAGNOSIS — I1310 Hypertensive heart and chronic kidney disease without heart failure, with stage 1 through stage 4 chronic kidney disease, or unspecified chronic kidney disease: Secondary | ICD-10-CM | POA: Diagnosis not present

## 2021-09-30 ENCOUNTER — Other Ambulatory Visit: Payer: Self-pay

## 2021-09-30 ENCOUNTER — Emergency Department (HOSPITAL_BASED_OUTPATIENT_CLINIC_OR_DEPARTMENT_OTHER): Payer: Medicare Other | Admitting: Radiology

## 2021-09-30 ENCOUNTER — Emergency Department (HOSPITAL_BASED_OUTPATIENT_CLINIC_OR_DEPARTMENT_OTHER): Payer: Medicare Other

## 2021-09-30 ENCOUNTER — Emergency Department (HOSPITAL_BASED_OUTPATIENT_CLINIC_OR_DEPARTMENT_OTHER)
Admission: EM | Admit: 2021-09-30 | Discharge: 2021-09-30 | Disposition: A | Discharge status: Home / Self Care | Payer: Medicare Other | Attending: Emergency Medicine | Admitting: Emergency Medicine

## 2021-09-30 DIAGNOSIS — G301 Alzheimer's disease with late onset: Secondary | ICD-10-CM | POA: Diagnosis not present

## 2021-09-30 DIAGNOSIS — W108XXA Fall (on) (from) other stairs and steps, initial encounter: Secondary | ICD-10-CM | POA: Diagnosis not present

## 2021-09-30 DIAGNOSIS — L814 Other melanin hyperpigmentation: Secondary | ICD-10-CM | POA: Diagnosis not present

## 2021-09-30 DIAGNOSIS — M1990 Unspecified osteoarthritis, unspecified site: Secondary | ICD-10-CM | POA: Diagnosis not present

## 2021-09-30 DIAGNOSIS — Z85828 Personal history of other malignant neoplasm of skin: Secondary | ICD-10-CM | POA: Diagnosis not present

## 2021-09-30 DIAGNOSIS — F329 Major depressive disorder, single episode, unspecified: Secondary | ICD-10-CM | POA: Diagnosis not present

## 2021-09-30 DIAGNOSIS — S199XXA Unspecified injury of neck, initial encounter: Secondary | ICD-10-CM | POA: Diagnosis not present

## 2021-09-30 DIAGNOSIS — R32 Unspecified urinary incontinence: Secondary | ICD-10-CM | POA: Diagnosis not present

## 2021-09-30 DIAGNOSIS — Y9301 Activity, walking, marching and hiking: Secondary | ICD-10-CM | POA: Diagnosis not present

## 2021-09-30 DIAGNOSIS — I10 Essential (primary) hypertension: Secondary | ICD-10-CM | POA: Insufficient documentation

## 2021-09-30 DIAGNOSIS — S0990XA Unspecified injury of head, initial encounter: Secondary | ICD-10-CM | POA: Diagnosis not present

## 2021-09-30 DIAGNOSIS — L578 Other skin changes due to chronic exposure to nonionizing radiation: Secondary | ICD-10-CM | POA: Diagnosis not present

## 2021-09-30 DIAGNOSIS — Z23 Encounter for immunization: Secondary | ICD-10-CM | POA: Diagnosis not present

## 2021-09-30 DIAGNOSIS — G35 Multiple sclerosis: Secondary | ICD-10-CM | POA: Diagnosis not present

## 2021-09-30 DIAGNOSIS — F0390 Unspecified dementia without behavioral disturbance: Secondary | ICD-10-CM | POA: Diagnosis not present

## 2021-09-30 DIAGNOSIS — M545 Low back pain, unspecified: Secondary | ICD-10-CM | POA: Insufficient documentation

## 2021-09-30 DIAGNOSIS — D225 Melanocytic nevi of trunk: Secondary | ICD-10-CM | POA: Diagnosis not present

## 2021-09-30 DIAGNOSIS — N183 Chronic kidney disease, stage 3 unspecified: Secondary | ICD-10-CM | POA: Diagnosis not present

## 2021-09-30 DIAGNOSIS — S0101XA Laceration without foreign body of scalp, initial encounter: Secondary | ICD-10-CM | POA: Diagnosis not present

## 2021-09-30 DIAGNOSIS — L821 Other seborrheic keratosis: Secondary | ICD-10-CM | POA: Diagnosis not present

## 2021-09-30 DIAGNOSIS — Z79899 Other long term (current) drug therapy: Secondary | ICD-10-CM | POA: Diagnosis not present

## 2021-09-30 DIAGNOSIS — L570 Actinic keratosis: Secondary | ICD-10-CM | POA: Diagnosis not present

## 2021-09-30 DIAGNOSIS — M810 Age-related osteoporosis without current pathological fracture: Secondary | ICD-10-CM | POA: Diagnosis not present

## 2021-09-30 DIAGNOSIS — M4316 Spondylolisthesis, lumbar region: Secondary | ICD-10-CM | POA: Diagnosis not present

## 2021-09-30 DIAGNOSIS — W19XXXA Unspecified fall, initial encounter: Secondary | ICD-10-CM | POA: Diagnosis not present

## 2021-09-30 DIAGNOSIS — R58 Hemorrhage, not elsewhere classified: Secondary | ICD-10-CM | POA: Diagnosis not present

## 2021-09-30 DIAGNOSIS — Z043 Encounter for examination and observation following other accident: Secondary | ICD-10-CM | POA: Diagnosis not present

## 2021-09-30 MED ORDER — LIDOCAINE-EPINEPHRINE (PF) 2 %-1:200000 IJ SOLN
10.0000 mL | Freq: Once | INTRAMUSCULAR | Status: AC
  Administered 2021-09-30: 10 mL via INTRADERMAL
  Filled 2021-09-30: qty 20

## 2021-09-30 MED ORDER — ACETAMINOPHEN 325 MG PO TABS
650.0000 mg | ORAL_TABLET | Freq: Once | ORAL | Status: AC
  Administered 2021-09-30: 650 mg via ORAL
  Filled 2021-09-30: qty 2

## 2021-09-30 MED ORDER — TETANUS-DIPHTH-ACELL PERTUSSIS 5-2.5-18.5 LF-MCG/0.5 IM SUSY
0.5000 mL | PREFILLED_SYRINGE | Freq: Once | INTRAMUSCULAR | Status: AC
  Administered 2021-09-30: 0.5 mL via INTRAMUSCULAR
  Filled 2021-09-30: qty 0.5

## 2021-09-30 MED ORDER — BACITRACIN ZINC 500 UNIT/GM EX OINT: TOPICAL_OINTMENT | Freq: Once | CUTANEOUS | Status: AC

## 2021-09-30 NOTE — ED Notes (Signed)
Family signed forms for Pt.  ?

## 2021-09-30 NOTE — ED Notes (Signed)
Lido and the suture cart left in/outside the room for the provider. ?

## 2021-09-30 NOTE — ED Triage Notes (Signed)
Arrived via EMS; from home; fall backwards from standing position, mechanical. Witnessed by family. +laceration back of head; no blood thinners; unknown LOC; reported hx of dementia.  ?

## 2021-09-30 NOTE — Discharge Instructions (Signed)
Follow-up in about 5-7 days with your primary care physician for staple removal.  Return to this or any other ER or your family doctor if you notice signs of infection such as redness, increasing pain, drainage, fever, etc. ? ?Otherwise apply bacitracin ointment 2 times per day ?

## 2021-09-30 NOTE — ED Provider Notes (Signed)
[facility] Provider Note   CSN: [id number] Arrival date & time: 09/30/21  1725  LEVEL 5 CAVEAT - DEMENTIA   History  Chief Complaint  Patient presents with   Kristina Donovan is a 76 y.o. female.  HPI 76 year old female with a history of dementia, relapsing-remitting MS, and hypertension presents with a fall and head injury.  History is mostly from the daughter due to her dementia.  The patient was walking down the steps with her daughter and turned around and then stepped backwards, thinking she was on the last step but she was still one-step to high and then fell backwards.  Struck the wall and then fell to the ground.  She was briefly unconscious according to the daughter.  Patient has a laceration to the back of her head.  She is not on blood thinners.  Was placed in a c-collar prior to me seeing her but denies neck or back pain.  No recent illness. Last Tdap 10+ years ago.  Home Medications Prior to Admission medications   Medication Sig Start Date End Date Taking? Authorizing Provider  ascorbic acid (VITAMIN C) 1000 MG tablet Take by mouth.    [provider]  calcium carbonate (OS-CAL) 600 MG TABS tablet Take 600 mg by mouth 2 (two) times daily with a meal.    [provider]  Cholecalciferol 50 MCG (2000 UT) CAPS Take by mouth.    [provider]  donepezil (ARICEPT) 10 MG tablet Take 10 mg by mouth at bedtime. 10/31/20   [provider]  fluticasone (FLONASE) 50 MCG/ACT nasal spray Place into both nostrils. 01/12/20   [provider]  Glatiramer Acetate (COPAXONE) 40 MG/ML SOSY Inject 40 mg into the skin 3 (three) times a week. At least 48 hours apart.  Brand name only. 02/20/20   Pieter Partridge, DO  hydrocortisone 2.5 % ointment Apply topically 2 (two) times daily. 09/26/20   [provider]  KLOR-CON M20 20 MEQ tablet TAKE 1 TABLET BY MOUTH EVERY DAY. MUST KEEP UPCOMING APPT FOR  REFILLS Patient not taking: Reported on 08/09/2019 11/06/17   Sueanne Margarita, MD  lansoprazole (PREVACID) 30 MG capsule Take 30 mg by mouth daily at 12 noon.    [provider]  lisinopril-hydrochlorothiazide (PRINZIDE,ZESTORETIC) 10-12.5 MG per tablet Take 1 tablet by mouth daily.    [provider]  loperamide (IMODIUM) 2 MG capsule Take by mouth.    [provider]  NONFORMULARY OR COMPOUNDED ITEM Antifungal solution: Terbinafine 3%, Fluconazole 2%, Tea Tree Oil 5%, Urea 10%, Ibuprofen 2% in DMSO suspension #31m 06/03/19   GMarzetta Board DPM  NONFORMULARY OR COMPOUNDED ITEM [facility]:  Antifungal topical - Terbinafine 3%, Fluconazole 2%, Tea Tree Oil 5%, Urea 10%, 2% Ibuprofen in #320mDMSO Suspension. Apply to affected toenail(s) once daily (at bedtime) or twice daily. 07/12/19   GaMarzetta BoardDPM  omeprazole (PRILOSEC) 20 MG capsule Take by mouth.    [provider]  ondansetron (ZOFRAN) 4 MG tablet Take 1 tablet (4 mg total) by mouth every 8 (eight) hours as needed for up to 15 doses for nausea or vomiting. 02/12/20   Curatolo, Adam, DO  oxyCODONE (ROXICODONE) 5 MG immediate release tablet Take 1 tablet (5 mg total) by mouth every 6 (six) hours as needed for up to 15 doses. 02/12/20   Curatolo, Adam, DO  pravastatin (PRAVACHOL) 40 MG tablet Take 40 mg by mouth daily.  [provider]  pyridOXINE (VITAMIN B-6) 100 MG tablet Take 100 mg by mouth daily.    [provider]  tamsulosin (FLOMAX) 0.4 MG CAPS capsule Take 0.4 mg by mouth daily.    [provider]  triamcinolone cream (KENALOG) 0.1 %  09/22/17   [provider]  vitamin B-12 (CYANOCOBALAMIN) 1000 MCG tablet Take 1,000 mcg by mouth every other day. Three times a week    [provider]  vitamin E 400 UNIT capsule Take 400 Units by mouth daily.    [provider]  zoledronic acid (RECLAST) 5 MG/100ML SOLN injection Inject 5 mg  into the vein once.    [provider]      Allergies    Shellfish allergy, Shellfish-derived products, Cetirizine, Fluzone quadrivalent [influenza vac split quad], Fluzone [influenza virus vaccine], Guaifenesin, Hemophilus b polysaccharide vaccine, Nitrofuran derivatives, and Sulfa antibiotics    Review of Systems   Review of Systems  Unable to perform ROS: Dementia   Physical Exam Updated Vital Signs BP (!) 173/76    Pulse 78    Temp 98.7 F (37.1 C)    Resp 18    Ht '5\' 7"'$  (1.702 m)    Wt 81.6 kg    SpO2 96%    BMI 28.19 kg/m  Physical Exam Vitals and nursing note reviewed.  Constitutional:      Appearance: She is well-developed.     Interventions: Cervical collar in place.  HENT:     Head: Normocephalic. Laceration present.   Cardiovascular:     Rate and Rhythm: Normal rate and regular rhythm.     Heart sounds: Normal heart sounds.  Pulmonary:     Effort: Pulmonary effort is normal.     Breath sounds: Normal breath sounds.  Abdominal:     Palpations: Abdomen is soft.     Tenderness: There is no abdominal tenderness.  Musculoskeletal:     Cervical back: No spinous process tenderness.     Lumbar back: Tenderness (mild) present.     Right hip: No tenderness. Normal range of motion.     Left hip: No tenderness. Normal range of motion.  Skin:    General: Skin is warm and dry.  Neurological:     Mental Status: She is alert.     Comments: CN 3-12 grossly intact. 5/5 strength in all 4 extremities. Grossly normal sensation. Normal finger to nose.     ED Results / Procedures / Treatments   Labs (all labs ordered are listed, but only abnormal results are displayed) Labs Reviewed - No data to display  EKG None  Radiology DG Lumbar Spine Complete  Result Date: 09/30/2021 CLINICAL DATA:  Fall EXAM: LUMBAR SPINE - COMPLETE 4+ VIEW COMPARISON:  None. FINDINGS: There is no evidence of lumbar spine fracture. Grade 1 anterolisthesis of L4. Moderate multilevel  degenerate disc disease with disc space narrowing and marginal osteophytes. There is multilevel facet joint arthropathy. IMPRESSION: 1.  No evidence of acute fracture. 2.  Grade 1 anterolisthesis of L4. 3. Moderate multilevel degenerate disc disease prominent at L1-L2 and L5-S1 with associated facet joint arthropathy. Electronically Signed   By: Keane Police D.O.   On: 09/30/2021 19:43   CT Head Wo Contrast  Result Date: 09/30/2021 CLINICAL DATA:  Head trauma, minor (Age >= 65y); Neck trauma (Age >= 65y) EXAM: CT HEAD WITHOUT CONTRAST CT CERVICAL SPINE WITHOUT CONTRAST TECHNIQUE: Multidetector CT imaging of the head and cervical spine was performed following the standard protocol  without intravenous contrast. Multiplanar CT image reconstructions of the cervical spine were also generated. RADIATION DOSE REDUCTION: This exam was performed according to the departmental dose-optimization program which includes automated exposure control, adjustment of the mA and/or kV according to patient size and/or use of iterative reconstruction technique. COMPARISON:  CT head 06/07/2014 FINDINGS: CT HEAD FINDINGS BRAIN: BRAIN Brain: Patchy and confluent areas of decreased attenuation are noted throughout the deep and periventricular white matter of the cerebral hemispheres bilaterally, compatible with chronic microvascular ischemic disease. No evidence of large-territorial acute infarction. No parenchymal hemorrhage. No mass lesion. No extra-axial collection. No mass effect or midline shift. No hydrocephalus. Basilar cisterns are patent. Vascular: No hyperdense vessel. Skull: No acute fracture or focal lesion. Sinuses/Orbits: Left sphenoid sinus mucosal thickening. Paranasal sinuses and mastoid air cells are clear. Bilateral lens replacement. Otherwise orbits are unremarkable. Other: Left occipital scalp subcutaneus soft tissue edema and emphysema. CT CERVICAL SPINE FINDINGS Alignment: Normal. Skull base and vertebrae: Moderate  degenerative changes of the spine. No acute fracture. No aggressive appearing focal osseous lesion or focal pathologic process. Soft tissues and spinal canal: No prevertebral fluid or swelling. No visible canal hematoma. Upper chest: Unremarkable. Other: None. IMPRESSION: 1. No acute intracranial abnormality. 2. No acute displaced fracture or traumatic listhesis of the cervical spine. Electronically Signed   By: Iven Finn M.D.   On: 09/30/2021 19:05   CT Cervical Spine Wo Contrast  Result Date: 09/30/2021 CLINICAL DATA:  Head trauma, minor (Age >= 65y); Neck trauma (Age >= 65y) EXAM: CT HEAD WITHOUT CONTRAST CT CERVICAL SPINE WITHOUT CONTRAST TECHNIQUE: Multidetector CT imaging of the head and cervical spine was performed following the standard protocol without intravenous contrast. Multiplanar CT image reconstructions of the cervical spine were also generated. RADIATION DOSE REDUCTION: This exam was performed according to the departmental dose-optimization program which includes automated exposure control, adjustment of the mA and/or kV according to patient size and/or use of iterative reconstruction technique. COMPARISON:  CT head 06/07/2014 FINDINGS: CT HEAD FINDINGS BRAIN: BRAIN Brain: Patchy and confluent areas of decreased attenuation are noted throughout the deep and periventricular white matter of the cerebral hemispheres bilaterally, compatible with chronic microvascular ischemic disease. No evidence of large-territorial acute infarction. No parenchymal hemorrhage. No mass lesion. No extra-axial collection. No mass effect or midline shift. No hydrocephalus. Basilar cisterns are patent. Vascular: No hyperdense vessel. Skull: No acute fracture or focal lesion. Sinuses/Orbits: Left sphenoid sinus mucosal thickening. Paranasal sinuses and mastoid air cells are clear. Bilateral lens replacement. Otherwise orbits are unremarkable. Other: Left occipital scalp subcutaneus soft tissue edema and emphysema.  CT CERVICAL SPINE FINDINGS Alignment: Normal. Skull base and vertebrae: Moderate degenerative changes of the spine. No acute fracture. No aggressive appearing focal osseous lesion or focal pathologic process. Soft tissues and spinal canal: No prevertebral fluid or swelling. No visible canal hematoma. Upper chest: Unremarkable. Other: None. IMPRESSION: 1. No acute intracranial abnormality. 2. No acute displaced fracture or traumatic listhesis of the cervical spine. Electronically Signed   By: Iven Finn M.D.   On: 09/30/2021 19:05   DG Hips Bilat W or Wo Pelvis 2 Views  Result Date: 09/30/2021 CLINICAL DATA:  Fall EXAM: DG HIP (WITH OR WITHOUT PELVIS) 2V BILAT COMPARISON:  None. FINDINGS: No evidence of hip fracture or dislocation. No evidence of displaced pelvic fracture. Mild degenerative changes of the bilateral SI joints. Soft tissues are. IMPRESSION: No acute osseous abnormality. Electronically Signed   By: Yetta Glassman M.D.   On: 09/30/2021 19:45  Procedures .Marland KitchenLaceration Repair  Date/Time: 09/30/2021 8:17 PM Performed by: Sherwood Gambler, MD Authorized by: Sherwood Gambler, MD   Consent:    Consent obtained:  Verbal   Consent given by:  Patient Laceration details:    Location:  Scalp   Scalp location:  Occipital   Length (cm):  3 Pre-procedure details:    Preparation:  Imaging obtained to evaluate for foreign bodies and patient was prepped and draped in usual sterile fashion Exploration:    Limited defect created (wound extended): no     Hemostasis achieved with:  Direct pressure   Imaging outcome: foreign body not noted   Treatment:    Area cleansed with:  Saline   Amount of cleaning:  Standard   Irrigation method:  Syringe Skin repair:    Repair method:  Staples   Number of staples:  4 Approximation:    Approximation:  Close Repair type:    Repair type:  Simple Post-procedure details:    Dressing:  Antibiotic ointment    Medications Ordered in ED Medications   lidocaine-EPINEPHrine (XYLOCAINE W/EPI) 2 %-1:200000 (PF) injection 10 mL (has no administration in time range)  bacitracin ointment (has no administration in time range)  Tdap (BOOSTRIX) injection 0.5 mL (0.5 mLs Intramuscular Given 09/30/21 1902)  acetaminophen (TYLENOL) tablet 650 mg (650 mg Oral Given 09/30/21 1907)    ED Course/ Medical Decision Making/ A&P                           Medical Decision Making Amount and/or Complexity of Data Reviewed Radiology: ordered.  Risk OTC drugs. Prescription drug management.   Patient presents after missing a step and falling backwards and striking her head.  She appears well and at her baseline here per family.  Laceration to the back of the scalp was not bleeding.  Tetanus was updated and she was given Tylenol for pain.  Otherwise, she is hypertensive here but no signs or symptoms of acute illness besides the traumatic head injury.  CT head and C-spine images were reviewed by myself and there is no obvious head bleed or skull fracture.  C-collar was removed after the negative C-spine imaging.  After walking to the bathroom she was noticing some low back/pelvic discomfort with walking though she could walk.  Thus lumbar x-rays and pelvis x-rays obtained and these are benign.  At this point she appears stable for discharge home.  This is not a grossly contaminated wound and she is not diabetic and is not on immunosuppressive therapy (she is not on MS treatment now) so I do not think antibiotics are warranted now.        Final Clinical Impression(s) / ED Diagnoses Final diagnoses:  Occipital scalp laceration, initial encounter    Rx / DC Orders ED Discharge Orders     None         Sherwood Gambler, MD 09/30/21 2019

## 2021-10-07 DIAGNOSIS — W19XXXD Unspecified fall, subsequent encounter: Secondary | ICD-10-CM | POA: Diagnosis not present

## 2021-10-07 DIAGNOSIS — R32 Unspecified urinary incontinence: Secondary | ICD-10-CM | POA: Diagnosis not present

## 2021-10-07 DIAGNOSIS — S0101XD Laceration without foreign body of scalp, subsequent encounter: Secondary | ICD-10-CM | POA: Diagnosis not present

## 2021-10-09 DIAGNOSIS — N1831 Chronic kidney disease, stage 3a: Secondary | ICD-10-CM | POA: Diagnosis not present

## 2021-10-09 DIAGNOSIS — I051 Rheumatic mitral insufficiency: Secondary | ICD-10-CM | POA: Diagnosis not present

## 2021-10-09 DIAGNOSIS — I700 Atherosclerosis of aorta: Secondary | ICD-10-CM | POA: Diagnosis not present

## 2021-10-09 DIAGNOSIS — M40209 Unspecified kyphosis, site unspecified: Secondary | ICD-10-CM | POA: Diagnosis not present

## 2021-10-09 DIAGNOSIS — I1310 Hypertensive heart and chronic kidney disease without heart failure, with stage 1 through stage 4 chronic kidney disease, or unspecified chronic kidney disease: Secondary | ICD-10-CM | POA: Diagnosis not present

## 2021-10-09 DIAGNOSIS — I447 Left bundle-branch block, unspecified: Secondary | ICD-10-CM | POA: Diagnosis not present

## 2021-10-10 DIAGNOSIS — I1310 Hypertensive heart and chronic kidney disease without heart failure, with stage 1 through stage 4 chronic kidney disease, or unspecified chronic kidney disease: Secondary | ICD-10-CM | POA: Diagnosis not present

## 2021-10-10 DIAGNOSIS — I051 Rheumatic mitral insufficiency: Secondary | ICD-10-CM | POA: Diagnosis not present

## 2021-10-10 DIAGNOSIS — M40209 Unspecified kyphosis, site unspecified: Secondary | ICD-10-CM | POA: Diagnosis not present

## 2021-10-10 DIAGNOSIS — I700 Atherosclerosis of aorta: Secondary | ICD-10-CM | POA: Diagnosis not present

## 2021-10-10 DIAGNOSIS — I447 Left bundle-branch block, unspecified: Secondary | ICD-10-CM | POA: Diagnosis not present

## 2021-10-10 DIAGNOSIS — N1831 Chronic kidney disease, stage 3a: Secondary | ICD-10-CM | POA: Diagnosis not present

## 2021-10-11 DIAGNOSIS — N1831 Chronic kidney disease, stage 3a: Secondary | ICD-10-CM | POA: Diagnosis not present

## 2021-10-11 DIAGNOSIS — I447 Left bundle-branch block, unspecified: Secondary | ICD-10-CM | POA: Diagnosis not present

## 2021-10-11 DIAGNOSIS — I1310 Hypertensive heart and chronic kidney disease without heart failure, with stage 1 through stage 4 chronic kidney disease, or unspecified chronic kidney disease: Secondary | ICD-10-CM | POA: Diagnosis not present

## 2021-10-11 DIAGNOSIS — I700 Atherosclerosis of aorta: Secondary | ICD-10-CM | POA: Diagnosis not present

## 2021-10-11 DIAGNOSIS — I051 Rheumatic mitral insufficiency: Secondary | ICD-10-CM | POA: Diagnosis not present

## 2021-10-11 DIAGNOSIS — M40209 Unspecified kyphosis, site unspecified: Secondary | ICD-10-CM | POA: Diagnosis not present

## 2021-10-16 ENCOUNTER — Other Ambulatory Visit: Payer: Self-pay | Admitting: Family Medicine

## 2021-10-16 ENCOUNTER — Ambulatory Visit
Admission: RE | Admit: 2021-10-16 | Discharge: 2021-10-16 | Disposition: A | Discharge status: Home / Self Care | Payer: Medicare Other | Source: Ambulatory Visit | Attending: Internal Medicine | Admitting: Internal Medicine

## 2021-10-16 DIAGNOSIS — I700 Atherosclerosis of aorta: Secondary | ICD-10-CM | POA: Diagnosis not present

## 2021-10-16 DIAGNOSIS — I447 Left bundle-branch block, unspecified: Secondary | ICD-10-CM | POA: Diagnosis not present

## 2021-10-16 DIAGNOSIS — I051 Rheumatic mitral insufficiency: Secondary | ICD-10-CM | POA: Diagnosis not present

## 2021-10-16 DIAGNOSIS — Z1231 Encounter for screening mammogram for malignant neoplasm of breast: Secondary | ICD-10-CM

## 2021-10-16 DIAGNOSIS — I1310 Hypertensive heart and chronic kidney disease without heart failure, with stage 1 through stage 4 chronic kidney disease, or unspecified chronic kidney disease: Secondary | ICD-10-CM | POA: Diagnosis not present

## 2021-10-16 DIAGNOSIS — M40209 Unspecified kyphosis, site unspecified: Secondary | ICD-10-CM | POA: Diagnosis not present

## 2021-10-16 DIAGNOSIS — N1831 Chronic kidney disease, stage 3a: Secondary | ICD-10-CM | POA: Diagnosis not present

## 2021-10-18 DIAGNOSIS — I051 Rheumatic mitral insufficiency: Secondary | ICD-10-CM | POA: Diagnosis not present

## 2021-10-18 DIAGNOSIS — N1831 Chronic kidney disease, stage 3a: Secondary | ICD-10-CM | POA: Diagnosis not present

## 2021-10-18 DIAGNOSIS — M40209 Unspecified kyphosis, site unspecified: Secondary | ICD-10-CM | POA: Diagnosis not present

## 2021-10-18 DIAGNOSIS — I700 Atherosclerosis of aorta: Secondary | ICD-10-CM | POA: Diagnosis not present

## 2021-10-18 DIAGNOSIS — I447 Left bundle-branch block, unspecified: Secondary | ICD-10-CM | POA: Diagnosis not present

## 2021-10-18 DIAGNOSIS — I1310 Hypertensive heart and chronic kidney disease without heart failure, with stage 1 through stage 4 chronic kidney disease, or unspecified chronic kidney disease: Secondary | ICD-10-CM | POA: Diagnosis not present

## 2021-10-20 DIAGNOSIS — R911 Solitary pulmonary nodule: Secondary | ICD-10-CM | POA: Diagnosis not present

## 2021-10-20 DIAGNOSIS — G35 Multiple sclerosis: Secondary | ICD-10-CM | POA: Diagnosis not present

## 2021-10-20 DIAGNOSIS — K589 Irritable bowel syndrome without diarrhea: Secondary | ICD-10-CM | POA: Diagnosis not present

## 2021-10-20 DIAGNOSIS — N3946 Mixed incontinence: Secondary | ICD-10-CM | POA: Diagnosis not present

## 2021-10-20 DIAGNOSIS — I447 Left bundle-branch block, unspecified: Secondary | ICD-10-CM | POA: Diagnosis not present

## 2021-10-20 DIAGNOSIS — I700 Atherosclerosis of aorta: Secondary | ICD-10-CM | POA: Diagnosis not present

## 2021-10-20 DIAGNOSIS — M40209 Unspecified kyphosis, site unspecified: Secondary | ICD-10-CM | POA: Diagnosis not present

## 2021-10-20 DIAGNOSIS — E041 Nontoxic single thyroid nodule: Secondary | ICD-10-CM | POA: Diagnosis not present

## 2021-10-20 DIAGNOSIS — D510 Vitamin B12 deficiency anemia due to intrinsic factor deficiency: Secondary | ICD-10-CM | POA: Diagnosis not present

## 2021-10-20 DIAGNOSIS — I1310 Hypertensive heart and chronic kidney disease without heart failure, with stage 1 through stage 4 chronic kidney disease, or unspecified chronic kidney disease: Secondary | ICD-10-CM | POA: Diagnosis not present

## 2021-10-20 DIAGNOSIS — F02A0 Dementia in other diseases classified elsewhere, mild, without behavioral disturbance, psychotic disturbance, mood disturbance, and anxiety: Secondary | ICD-10-CM | POA: Diagnosis not present

## 2021-10-20 DIAGNOSIS — I051 Rheumatic mitral insufficiency: Secondary | ICD-10-CM | POA: Diagnosis not present

## 2021-10-20 DIAGNOSIS — G301 Alzheimer's disease with late onset: Secondary | ICD-10-CM | POA: Diagnosis not present

## 2021-10-20 DIAGNOSIS — Z87442 Personal history of urinary calculi: Secondary | ICD-10-CM | POA: Diagnosis not present

## 2021-10-20 DIAGNOSIS — R159 Full incontinence of feces: Secondary | ICD-10-CM | POA: Diagnosis not present

## 2021-10-20 DIAGNOSIS — M810 Age-related osteoporosis without current pathological fracture: Secondary | ICD-10-CM | POA: Diagnosis not present

## 2021-10-20 DIAGNOSIS — J3089 Other allergic rhinitis: Secondary | ICD-10-CM | POA: Diagnosis not present

## 2021-10-20 DIAGNOSIS — E7800 Pure hypercholesterolemia, unspecified: Secondary | ICD-10-CM | POA: Diagnosis not present

## 2021-10-20 DIAGNOSIS — N1831 Chronic kidney disease, stage 3a: Secondary | ICD-10-CM | POA: Diagnosis not present

## 2021-10-23 DIAGNOSIS — I700 Atherosclerosis of aorta: Secondary | ICD-10-CM | POA: Diagnosis not present

## 2021-10-23 DIAGNOSIS — I051 Rheumatic mitral insufficiency: Secondary | ICD-10-CM | POA: Diagnosis not present

## 2021-10-23 DIAGNOSIS — I1310 Hypertensive heart and chronic kidney disease without heart failure, with stage 1 through stage 4 chronic kidney disease, or unspecified chronic kidney disease: Secondary | ICD-10-CM | POA: Diagnosis not present

## 2021-10-23 DIAGNOSIS — M40209 Unspecified kyphosis, site unspecified: Secondary | ICD-10-CM | POA: Diagnosis not present

## 2021-10-23 DIAGNOSIS — N1831 Chronic kidney disease, stage 3a: Secondary | ICD-10-CM | POA: Diagnosis not present

## 2021-10-23 DIAGNOSIS — I447 Left bundle-branch block, unspecified: Secondary | ICD-10-CM | POA: Diagnosis not present

## 2021-10-24 DIAGNOSIS — I051 Rheumatic mitral insufficiency: Secondary | ICD-10-CM | POA: Diagnosis not present

## 2021-10-24 DIAGNOSIS — M40209 Unspecified kyphosis, site unspecified: Secondary | ICD-10-CM | POA: Diagnosis not present

## 2021-10-24 DIAGNOSIS — I447 Left bundle-branch block, unspecified: Secondary | ICD-10-CM | POA: Diagnosis not present

## 2021-10-24 DIAGNOSIS — N1831 Chronic kidney disease, stage 3a: Secondary | ICD-10-CM | POA: Diagnosis not present

## 2021-10-24 DIAGNOSIS — I700 Atherosclerosis of aorta: Secondary | ICD-10-CM | POA: Diagnosis not present

## 2021-10-24 DIAGNOSIS — I1310 Hypertensive heart and chronic kidney disease without heart failure, with stage 1 through stage 4 chronic kidney disease, or unspecified chronic kidney disease: Secondary | ICD-10-CM | POA: Diagnosis not present

## 2021-10-30 DIAGNOSIS — N1831 Chronic kidney disease, stage 3a: Secondary | ICD-10-CM | POA: Diagnosis not present

## 2021-10-30 DIAGNOSIS — I1310 Hypertensive heart and chronic kidney disease without heart failure, with stage 1 through stage 4 chronic kidney disease, or unspecified chronic kidney disease: Secondary | ICD-10-CM | POA: Diagnosis not present

## 2021-10-30 DIAGNOSIS — I447 Left bundle-branch block, unspecified: Secondary | ICD-10-CM | POA: Diagnosis not present

## 2021-10-30 DIAGNOSIS — M40209 Unspecified kyphosis, site unspecified: Secondary | ICD-10-CM | POA: Diagnosis not present

## 2021-10-30 DIAGNOSIS — I051 Rheumatic mitral insufficiency: Secondary | ICD-10-CM | POA: Diagnosis not present

## 2021-10-30 DIAGNOSIS — I700 Atherosclerosis of aorta: Secondary | ICD-10-CM | POA: Diagnosis not present

## 2021-11-06 DIAGNOSIS — I1310 Hypertensive heart and chronic kidney disease without heart failure, with stage 1 through stage 4 chronic kidney disease, or unspecified chronic kidney disease: Secondary | ICD-10-CM | POA: Diagnosis not present

## 2021-11-06 DIAGNOSIS — M40209 Unspecified kyphosis, site unspecified: Secondary | ICD-10-CM | POA: Diagnosis not present

## 2021-11-06 DIAGNOSIS — I051 Rheumatic mitral insufficiency: Secondary | ICD-10-CM | POA: Diagnosis not present

## 2021-11-06 DIAGNOSIS — I447 Left bundle-branch block, unspecified: Secondary | ICD-10-CM | POA: Diagnosis not present

## 2021-11-06 DIAGNOSIS — N1831 Chronic kidney disease, stage 3a: Secondary | ICD-10-CM | POA: Diagnosis not present

## 2021-11-06 DIAGNOSIS — I700 Atherosclerosis of aorta: Secondary | ICD-10-CM | POA: Diagnosis not present

## 2021-11-07 DIAGNOSIS — I051 Rheumatic mitral insufficiency: Secondary | ICD-10-CM | POA: Diagnosis not present

## 2021-11-07 DIAGNOSIS — I700 Atherosclerosis of aorta: Secondary | ICD-10-CM | POA: Diagnosis not present

## 2021-11-07 DIAGNOSIS — N1831 Chronic kidney disease, stage 3a: Secondary | ICD-10-CM | POA: Diagnosis not present

## 2021-11-07 DIAGNOSIS — I447 Left bundle-branch block, unspecified: Secondary | ICD-10-CM | POA: Diagnosis not present

## 2021-11-07 DIAGNOSIS — I1310 Hypertensive heart and chronic kidney disease without heart failure, with stage 1 through stage 4 chronic kidney disease, or unspecified chronic kidney disease: Secondary | ICD-10-CM | POA: Diagnosis not present

## 2021-11-07 DIAGNOSIS — M40209 Unspecified kyphosis, site unspecified: Secondary | ICD-10-CM | POA: Diagnosis not present

## 2021-11-13 DIAGNOSIS — I700 Atherosclerosis of aorta: Secondary | ICD-10-CM | POA: Diagnosis not present

## 2021-11-13 DIAGNOSIS — N1831 Chronic kidney disease, stage 3a: Secondary | ICD-10-CM | POA: Diagnosis not present

## 2021-11-13 DIAGNOSIS — I051 Rheumatic mitral insufficiency: Secondary | ICD-10-CM | POA: Diagnosis not present

## 2021-11-13 DIAGNOSIS — I1310 Hypertensive heart and chronic kidney disease without heart failure, with stage 1 through stage 4 chronic kidney disease, or unspecified chronic kidney disease: Secondary | ICD-10-CM | POA: Diagnosis not present

## 2021-11-13 DIAGNOSIS — I447 Left bundle-branch block, unspecified: Secondary | ICD-10-CM | POA: Diagnosis not present

## 2021-11-13 DIAGNOSIS — M40209 Unspecified kyphosis, site unspecified: Secondary | ICD-10-CM | POA: Diagnosis not present

## 2021-11-14 DIAGNOSIS — I1310 Hypertensive heart and chronic kidney disease without heart failure, with stage 1 through stage 4 chronic kidney disease, or unspecified chronic kidney disease: Secondary | ICD-10-CM | POA: Diagnosis not present

## 2021-11-14 DIAGNOSIS — I051 Rheumatic mitral insufficiency: Secondary | ICD-10-CM | POA: Diagnosis not present

## 2021-11-14 DIAGNOSIS — M40209 Unspecified kyphosis, site unspecified: Secondary | ICD-10-CM | POA: Diagnosis not present

## 2021-11-14 DIAGNOSIS — I447 Left bundle-branch block, unspecified: Secondary | ICD-10-CM | POA: Diagnosis not present

## 2021-11-14 DIAGNOSIS — I700 Atherosclerosis of aorta: Secondary | ICD-10-CM | POA: Diagnosis not present

## 2021-11-14 DIAGNOSIS — N1831 Chronic kidney disease, stage 3a: Secondary | ICD-10-CM | POA: Diagnosis not present

## 2021-11-19 DIAGNOSIS — E7800 Pure hypercholesterolemia, unspecified: Secondary | ICD-10-CM | POA: Diagnosis not present

## 2021-11-19 DIAGNOSIS — D510 Vitamin B12 deficiency anemia due to intrinsic factor deficiency: Secondary | ICD-10-CM | POA: Diagnosis not present

## 2021-11-19 DIAGNOSIS — G301 Alzheimer's disease with late onset: Secondary | ICD-10-CM | POA: Diagnosis not present

## 2021-11-19 DIAGNOSIS — J3089 Other allergic rhinitis: Secondary | ICD-10-CM | POA: Diagnosis not present

## 2021-11-19 DIAGNOSIS — R159 Full incontinence of feces: Secondary | ICD-10-CM | POA: Diagnosis not present

## 2021-11-19 DIAGNOSIS — K589 Irritable bowel syndrome without diarrhea: Secondary | ICD-10-CM | POA: Diagnosis not present

## 2021-11-19 DIAGNOSIS — N1831 Chronic kidney disease, stage 3a: Secondary | ICD-10-CM | POA: Diagnosis not present

## 2021-11-19 DIAGNOSIS — R911 Solitary pulmonary nodule: Secondary | ICD-10-CM | POA: Diagnosis not present

## 2021-11-19 DIAGNOSIS — G35 Multiple sclerosis: Secondary | ICD-10-CM | POA: Diagnosis not present

## 2021-11-19 DIAGNOSIS — M810 Age-related osteoporosis without current pathological fracture: Secondary | ICD-10-CM | POA: Diagnosis not present

## 2021-11-19 DIAGNOSIS — F02A0 Dementia in other diseases classified elsewhere, mild, without behavioral disturbance, psychotic disturbance, mood disturbance, and anxiety: Secondary | ICD-10-CM | POA: Diagnosis not present

## 2021-11-19 DIAGNOSIS — I447 Left bundle-branch block, unspecified: Secondary | ICD-10-CM | POA: Diagnosis not present

## 2021-11-19 DIAGNOSIS — I700 Atherosclerosis of aorta: Secondary | ICD-10-CM | POA: Diagnosis not present

## 2021-11-19 DIAGNOSIS — N3946 Mixed incontinence: Secondary | ICD-10-CM | POA: Diagnosis not present

## 2021-11-19 DIAGNOSIS — I1310 Hypertensive heart and chronic kidney disease without heart failure, with stage 1 through stage 4 chronic kidney disease, or unspecified chronic kidney disease: Secondary | ICD-10-CM | POA: Diagnosis not present

## 2021-11-19 DIAGNOSIS — Z87442 Personal history of urinary calculi: Secondary | ICD-10-CM | POA: Diagnosis not present

## 2021-11-19 DIAGNOSIS — I051 Rheumatic mitral insufficiency: Secondary | ICD-10-CM | POA: Diagnosis not present

## 2021-11-19 DIAGNOSIS — M40209 Unspecified kyphosis, site unspecified: Secondary | ICD-10-CM | POA: Diagnosis not present

## 2021-11-19 DIAGNOSIS — E041 Nontoxic single thyroid nodule: Secondary | ICD-10-CM | POA: Diagnosis not present

## 2021-11-20 DIAGNOSIS — M40209 Unspecified kyphosis, site unspecified: Secondary | ICD-10-CM | POA: Diagnosis not present

## 2021-11-20 DIAGNOSIS — N1831 Chronic kidney disease, stage 3a: Secondary | ICD-10-CM | POA: Diagnosis not present

## 2021-11-20 DIAGNOSIS — I447 Left bundle-branch block, unspecified: Secondary | ICD-10-CM | POA: Diagnosis not present

## 2021-11-20 DIAGNOSIS — I051 Rheumatic mitral insufficiency: Secondary | ICD-10-CM | POA: Diagnosis not present

## 2021-11-20 DIAGNOSIS — I700 Atherosclerosis of aorta: Secondary | ICD-10-CM | POA: Diagnosis not present

## 2021-11-20 DIAGNOSIS — I1310 Hypertensive heart and chronic kidney disease without heart failure, with stage 1 through stage 4 chronic kidney disease, or unspecified chronic kidney disease: Secondary | ICD-10-CM | POA: Diagnosis not present

## 2021-11-26 DIAGNOSIS — Z887 Allergy status to serum and vaccine status: Secondary | ICD-10-CM | POA: Diagnosis not present

## 2021-11-26 DIAGNOSIS — G301 Alzheimer's disease with late onset: Secondary | ICD-10-CM | POA: Diagnosis not present

## 2021-11-26 DIAGNOSIS — Z882 Allergy status to sulfonamides status: Secondary | ICD-10-CM | POA: Diagnosis not present

## 2021-11-26 DIAGNOSIS — G35 Multiple sclerosis: Secondary | ICD-10-CM | POA: Diagnosis not present

## 2021-11-26 DIAGNOSIS — Z881 Allergy status to other antibiotic agents status: Secondary | ICD-10-CM | POA: Diagnosis not present

## 2021-11-26 DIAGNOSIS — F02A0 Dementia in other diseases classified elsewhere, mild, without behavioral disturbance, psychotic disturbance, mood disturbance, and anxiety: Secondary | ICD-10-CM | POA: Diagnosis not present

## 2021-11-26 DIAGNOSIS — F03B0 Unspecified dementia, moderate, without behavioral disturbance, psychotic disturbance, mood disturbance, and anxiety: Secondary | ICD-10-CM | POA: Diagnosis not present

## 2021-11-27 DIAGNOSIS — I051 Rheumatic mitral insufficiency: Secondary | ICD-10-CM | POA: Diagnosis not present

## 2021-11-27 DIAGNOSIS — I447 Left bundle-branch block, unspecified: Secondary | ICD-10-CM | POA: Diagnosis not present

## 2021-11-27 DIAGNOSIS — I700 Atherosclerosis of aorta: Secondary | ICD-10-CM | POA: Diagnosis not present

## 2021-11-27 DIAGNOSIS — M40209 Unspecified kyphosis, site unspecified: Secondary | ICD-10-CM | POA: Diagnosis not present

## 2021-11-27 DIAGNOSIS — N1831 Chronic kidney disease, stage 3a: Secondary | ICD-10-CM | POA: Diagnosis not present

## 2021-11-27 DIAGNOSIS — I1310 Hypertensive heart and chronic kidney disease without heart failure, with stage 1 through stage 4 chronic kidney disease, or unspecified chronic kidney disease: Secondary | ICD-10-CM | POA: Diagnosis not present

## 2021-12-04 DIAGNOSIS — I1310 Hypertensive heart and chronic kidney disease without heart failure, with stage 1 through stage 4 chronic kidney disease, or unspecified chronic kidney disease: Secondary | ICD-10-CM | POA: Diagnosis not present

## 2021-12-04 DIAGNOSIS — N1831 Chronic kidney disease, stage 3a: Secondary | ICD-10-CM | POA: Diagnosis not present

## 2021-12-04 DIAGNOSIS — I700 Atherosclerosis of aorta: Secondary | ICD-10-CM | POA: Diagnosis not present

## 2021-12-04 DIAGNOSIS — I447 Left bundle-branch block, unspecified: Secondary | ICD-10-CM | POA: Diagnosis not present

## 2021-12-04 DIAGNOSIS — I051 Rheumatic mitral insufficiency: Secondary | ICD-10-CM | POA: Diagnosis not present

## 2021-12-04 DIAGNOSIS — M40209 Unspecified kyphosis, site unspecified: Secondary | ICD-10-CM | POA: Diagnosis not present

## 2021-12-11 DIAGNOSIS — N1831 Chronic kidney disease, stage 3a: Secondary | ICD-10-CM | POA: Diagnosis not present

## 2021-12-11 DIAGNOSIS — I447 Left bundle-branch block, unspecified: Secondary | ICD-10-CM | POA: Diagnosis not present

## 2021-12-11 DIAGNOSIS — M40209 Unspecified kyphosis, site unspecified: Secondary | ICD-10-CM | POA: Diagnosis not present

## 2021-12-11 DIAGNOSIS — I1310 Hypertensive heart and chronic kidney disease without heart failure, with stage 1 through stage 4 chronic kidney disease, or unspecified chronic kidney disease: Secondary | ICD-10-CM | POA: Diagnosis not present

## 2021-12-11 DIAGNOSIS — I051 Rheumatic mitral insufficiency: Secondary | ICD-10-CM | POA: Diagnosis not present

## 2021-12-11 DIAGNOSIS — I700 Atherosclerosis of aorta: Secondary | ICD-10-CM | POA: Diagnosis not present

## 2021-12-27 ENCOUNTER — Ambulatory Visit (INDEPENDENT_AMBULATORY_CARE_PROVIDER_SITE_OTHER): Payer: Medicare Other | Admitting: Podiatry

## 2021-12-27 ENCOUNTER — Encounter: Payer: Self-pay | Admitting: Podiatry

## 2021-12-27 DIAGNOSIS — F329 Major depressive disorder, single episode, unspecified: Secondary | ICD-10-CM | POA: Insufficient documentation

## 2021-12-27 DIAGNOSIS — B351 Tinea unguium: Secondary | ICD-10-CM

## 2021-12-27 DIAGNOSIS — T17308A Unspecified foreign body in larynx causing other injury, initial encounter: Secondary | ICD-10-CM | POA: Insufficient documentation

## 2021-12-27 DIAGNOSIS — M79676 Pain in unspecified toe(s): Secondary | ICD-10-CM

## 2022-01-01 NOTE — Progress Notes (Signed)
  Subjective:  Patient ID: Kristina Donovan, female    DOB: April 09, 1946,  MRN: [id number]  Kristina Donovan presents to clinic today for painful thick toenails that are difficult to trim. Pain interferes with ambulation. Aggravating factors include wearing enclosed shoe gear. Pain is relieved with periodic professional debridement.  Patient has been diagnosed with dementia. Her daughter is present during today's visit. Daughter states she has trimmed Mom's nails on occasion and would like to increase time between visits if possible due to patient's fear of leaving the home at times. She did complete laser therapy and a few nails did improve.  New problem(s): None.   PCP is Collene Leyden, MD , and last visit was Dec 09, 2021.  Allergies  Allergen Reactions   Shellfish Allergy Anaphylaxis   Shellfish-Derived Products Anaphylaxis   Cetirizine Swelling   Fluzone Quadrivalent [Influenza Vac Split Quad]     Other reaction(s): increased MS sx   Fluzone [Influenza Virus Vaccine]     Increased MS   Guaifenesin     Other reaction(s): Other (See Comments)   Haemophilus B Polysaccharide Vaccine     Other reaction(s): Other (See Comments) Increased MS   Nitrofuran Derivatives Diarrhea   Sulfa Antibiotics Hives    Review of Systems: Negative except as noted in the HPI.  Objective: No changes noted in today's physical examination.  General: Kristina Donovan is a pleasant 76 y.o. Caucasian female, WD, WN in NAD. AAO x 3.   Vascular:  Capillary refill time to digits immediate b/l. Palpable pedal pulses b/l LE. Pedal hair present. Lower extremity skin temperature gradient within normal limits. No pain with calf compression b/l.  Dermatological:  Pedal skin with normal turgor, texture and tone bilaterally. No open wounds bilaterally. No interdigital macerations bilaterally. Toenails 2-5 right elongated, discolored, dystrophic, thickened, and crumbly with subungual debris and  tenderness to dorsal palpation. Left hallux nail 75% cleared of fungal debris.  Musculoskeletal:  Normal muscle strength 5/5 to all lower extremity muscle groups bilaterally. No pain crepitus or joint limitation noted with ROM b/l. No gross bony deformities bilaterally.  Neurological:  Protective sensation intact 5/5 intact bilaterally with 10g monofilament b/l. Vibratory sensation intact b/l. Clonus negative b/l.  Assessment/Plan: 1. Pain due to onychomycosis of toenail      -Patient was evaluated and treated. All patient's and/or POA's questions/concerns answered on today's visit. -Patient completed laser therapy with some response of left great toenail. Right great, 2nd, 4th and 5th toenails did not respond. We did discuss increasing her intervals to 4 months, but daughter would like to keep it at 3 months for now and call if she'd like to extend time. -Patient to continue soft, supportive shoe gear daily. -Mycotic toenails L hallux, right great toe, R 2nd toe, R 4th toe, and R 5th toe were debrided in length and girth with sterile nail nippers and dremel without iatrogenic bleeding. -Patient/POA to call should there be question/concern in the interim.   Return in about 3 months (around 03/29/2022).  Marzetta Board, DPM

## 2022-02-20 DIAGNOSIS — M810 Age-related osteoporosis without current pathological fracture: Secondary | ICD-10-CM | POA: Diagnosis not present

## 2022-02-20 DIAGNOSIS — Z131 Encounter for screening for diabetes mellitus: Secondary | ICD-10-CM | POA: Diagnosis not present

## 2022-02-20 DIAGNOSIS — N9489 Other specified conditions associated with female genital organs and menstrual cycle: Secondary | ICD-10-CM | POA: Diagnosis not present

## 2022-02-20 DIAGNOSIS — D510 Vitamin B12 deficiency anemia due to intrinsic factor deficiency: Secondary | ICD-10-CM | POA: Diagnosis not present

## 2022-02-20 DIAGNOSIS — G35 Multiple sclerosis: Secondary | ICD-10-CM | POA: Diagnosis not present

## 2022-02-20 DIAGNOSIS — R32 Unspecified urinary incontinence: Secondary | ICD-10-CM | POA: Diagnosis not present

## 2022-02-20 DIAGNOSIS — Z1159 Encounter for screening for other viral diseases: Secondary | ICD-10-CM | POA: Diagnosis not present

## 2022-02-20 DIAGNOSIS — E7800 Pure hypercholesterolemia, unspecified: Secondary | ICD-10-CM | POA: Diagnosis not present

## 2022-02-20 DIAGNOSIS — R8290 Unspecified abnormal findings in urine: Secondary | ICD-10-CM | POA: Diagnosis not present

## 2022-02-20 DIAGNOSIS — I10 Essential (primary) hypertension: Secondary | ICD-10-CM | POA: Diagnosis not present

## 2022-02-20 DIAGNOSIS — R194 Change in bowel habit: Secondary | ICD-10-CM | POA: Diagnosis not present

## 2022-02-20 DIAGNOSIS — Z0000 Encounter for general adult medical examination without abnormal findings: Secondary | ICD-10-CM | POA: Diagnosis not present

## 2022-03-06 ENCOUNTER — Telehealth: Payer: Self-pay

## 2022-03-07 ENCOUNTER — Telehealth (INDEPENDENT_AMBULATORY_CARE_PROVIDER_SITE_OTHER): Payer: Medicare Other | Admitting: Podiatry

## 2022-03-07 NOTE — Telephone Encounter (Signed)
Patient with h/o onychomycosis s/p laser therapy. Spoke to patient's daughter who had questions regarding permanent nail avulsions for her Mom. 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 Daughter would like to proceed with having fungal toenails removed permanently for hygiene reasons. I explained procedure to her and she will schedule with either Dr. Blenda Mounts or Dr. Sherryle Lis for procedure for right great toe and right 2nd toe.

## 2022-03-07 NOTE — Telephone Encounter (Signed)
Noted, thanks!

## 2022-03-26 ENCOUNTER — Other Ambulatory Visit: Payer: Self-pay

## 2022-03-26 ENCOUNTER — Emergency Department (HOSPITAL_BASED_OUTPATIENT_CLINIC_OR_DEPARTMENT_OTHER): Payer: Medicare Other

## 2022-03-26 ENCOUNTER — Encounter (HOSPITAL_BASED_OUTPATIENT_CLINIC_OR_DEPARTMENT_OTHER): Payer: Self-pay

## 2022-03-26 ENCOUNTER — Emergency Department (HOSPITAL_BASED_OUTPATIENT_CLINIC_OR_DEPARTMENT_OTHER)
Admission: EM | Admit: 2022-03-26 | Discharge: 2022-03-26 | Disposition: A | Discharge status: Home / Self Care | Payer: Medicare Other | Attending: Emergency Medicine | Admitting: Emergency Medicine

## 2022-03-26 DIAGNOSIS — I10 Essential (primary) hypertension: Secondary | ICD-10-CM | POA: Insufficient documentation

## 2022-03-26 DIAGNOSIS — N200 Calculus of kidney: Secondary | ICD-10-CM | POA: Diagnosis not present

## 2022-03-26 DIAGNOSIS — I2510 Atherosclerotic heart disease of native coronary artery without angina pectoris: Secondary | ICD-10-CM | POA: Diagnosis not present

## 2022-03-26 DIAGNOSIS — Z79899 Other long term (current) drug therapy: Secondary | ICD-10-CM | POA: Diagnosis not present

## 2022-03-26 DIAGNOSIS — G309 Alzheimer's disease, unspecified: Secondary | ICD-10-CM | POA: Insufficient documentation

## 2022-03-26 DIAGNOSIS — F0390 Unspecified dementia without behavioral disturbance: Secondary | ICD-10-CM | POA: Diagnosis not present

## 2022-03-26 DIAGNOSIS — R109 Unspecified abdominal pain: Secondary | ICD-10-CM | POA: Diagnosis present

## 2022-03-26 LAB — URINALYSIS, ROUTINE W REFLEX MICROSCOPIC
Bilirubin Urine: NEGATIVE
Glucose, UA: NEGATIVE mg/dL
Ketones, ur: NEGATIVE mg/dL
Nitrite: NEGATIVE
Specific Gravity, Urine: 1.025 (ref 1.005–1.030)
pH: 5 (ref 5.0–8.0)

## 2022-03-26 LAB — COMPREHENSIVE METABOLIC PANEL
ALT: 12 U/L (ref 0–44)
AST: 17 U/L (ref 15–41)
Albumin: 4.7 g/dL (ref 3.5–5.0)
Alkaline Phosphatase: 50 U/L (ref 38–126)
Anion gap: 9 (ref 5–15)
BUN: 32 mg/dL — ABNORMAL HIGH (ref 8–23)
CO2: 28 mmol/L (ref 22–32)
Calcium: 10.7 mg/dL — ABNORMAL HIGH (ref 8.9–10.3)
Chloride: 103 mmol/L (ref 98–111)
Creatinine, Ser: 1.72 mg/dL — ABNORMAL HIGH (ref 0.44–1.00)
GFR, Estimated: 31 mL/min — ABNORMAL LOW (ref >60)
Glucose, Bld: 118 mg/dL — ABNORMAL HIGH (ref 70–99)
Potassium: 3.9 mmol/L (ref 3.5–5.1)
Sodium: 140 mmol/L (ref 135–145)
Total Bilirubin: 0.6 mg/dL (ref 0.3–1.2)
Total Protein: 7.4 g/dL (ref 6.5–8.1)

## 2022-03-26 LAB — LIPASE, BLOOD: Lipase: 32 U/L (ref 11–51)

## 2022-03-26 LAB — CBC
HCT: 38.9 % (ref 36.0–46.0)
Hemoglobin: 12.6 g/dL (ref 12.0–15.0)
MCH: 28.8 pg (ref 26.0–34.0)
MCHC: 32.4 g/dL (ref 30.0–36.0)
MCV: 89 fL (ref 80.0–100.0)
Platelets: 197 10*3/uL (ref 150–400)
RBC: 4.37 MIL/uL (ref 3.87–5.11)
RDW: 12.7 % (ref 11.5–15.5)
WBC: 11.6 10*3/uL — ABNORMAL HIGH (ref 4.0–10.5)
nRBC: 0 % (ref 0.0–0.2)

## 2022-03-26 MED ORDER — KETOROLAC TROMETHAMINE 15 MG/ML IJ SOLN: 15.0000 mg | Freq: Once | INTRAMUSCULAR | Status: DC

## 2022-03-26 MED ORDER — FENTANYL CITRATE PF 50 MCG/ML IJ SOSY: 25.0000 ug | PREFILLED_SYRINGE | Freq: Once | INTRAMUSCULAR | Status: DC

## 2022-03-26 MED ORDER — SODIUM CHLORIDE 0.9 % IV SOLN: 1.0000 g | Freq: Once | INTRAVENOUS | Status: DC

## 2022-03-26 MED ORDER — LACTATED RINGERS IV BOLUS
1000.0000 mL | Freq: Once | INTRAVENOUS | Status: AC
  Administered 2022-03-26: 1000 mL via INTRAVENOUS

## 2022-03-26 MED ORDER — CEFPODOXIME PROXETIL 200 MG PO TABS: 200.0000 mg | ORAL_TABLET | Freq: Two times a day (BID) | ORAL | 0 refills | Status: AC

## 2022-03-26 MED ORDER — ONDANSETRON HCL 4 MG/2ML IJ SOLN
4.0000 mg | Freq: Once | INTRAMUSCULAR | Status: DC
  Filled 2022-03-26: qty 2

## 2022-03-26 NOTE — ED Notes (Signed)
Patient transported to CT 

## 2022-03-26 NOTE — Discharge Instructions (Addendum)
Recommend outpatient follow-up with urology for further management of nephrolithiasis.  Recommend recheck of her kidney function in 2 to 3 days to ensure improvement.  Recommend continued oral hydration, Tylenol for pain control.  Antibiotics have been reordered to cover for UTI in the setting of a kidney stone.  Return for signs of systemic infection, severe pain, nausea vomiting, inability to tolerate oral intake.  Severe infection signs would be fever and chills, worsening pain, somnolence, increased urinary frequency and pain while urinating.  CT results: IMPRESSION:  1. 3 mm stone at the left ureterovesicular junction causes moderate  to marked left hydronephrosis and moderate left hydroureter.  2. No other acute abnormality within the abdomen or pelvis.  3. Small bilateral nonobstructing intrarenal stones.

## 2022-03-26 NOTE — ED Notes (Signed)
Pt currently unable to provide urine sample. Pt provided urine cup and instructed to place sample in designated spot in lobby.

## 2022-03-26 NOTE — ED Provider Notes (Addendum)
[facility] EMERGENCY DEPT Provider Note   CSN: [id number] Arrival date & time: 03/26/22  1354     History  Chief Complaint  Patient presents with   Flank Pain    Kristina Donovan is a 76 y.o. female.   Flank Pain Associated symptoms include abdominal pain.     77 year old female with medical history significant for Alzheimer's dementia, HTN, HLD, CAD, nephrolithiasis who presents to the emergency department with left-sided flank pain, left lower quadrant abdominal pain, increased foul-smelling urine.  The patient has dementia at baseline.  She had some nausea and an episode of emesis yesterday that was NBNB.  No known fevers.  She just recently completed a course of antibiotics for urinary tract infection.  She does have a history of kidney stones and her symptoms seem to be similar to a prior episode.  She is difficult on arrival to get a history from as she has dementia at baseline.  She presents with family members bedside who provide additional history.  Home Medications Prior to Admission medications   Medication Sig Start Date End Date Taking? Authorizing Provider  cefpodoxime (VANTIN) 200 MG tablet Take 1 tablet (200 mg total) by mouth 2 (two) times daily for 7 days. 03/26/22 04/02/22 Yes Regan Lemming, MD  ascorbic acid (VITAMIN C) 1000 MG tablet Take by mouth.    [provider]  calcium carbonate (OS-CAL) 600 MG TABS tablet Take 600 mg by mouth 2 (two) times daily with a meal.    [provider]  cetirizine (ZYRTEC ALLERGY) 10 MG tablet 1 tablet    [provider]  Cholecalciferol (VITAMIN D3) 50 MCG (2000 UT) capsule 1 capsule    [provider]  donepezil (ARICEPT) 10 MG tablet Take 10 mg by mouth at bedtime. 10/31/20   [provider]  escitalopram (LEXAPRO) 10 MG tablet Take 10 mg by mouth daily. 11/21/21   [provider]  fluticasone (FLONASE) 50 MCG/ACT nasal spray Place into both nostrils.  01/12/20   [provider]  Glatiramer Acetate (COPAXONE) 40 MG/ML SOSY Inject 40 mg into the skin 3 (three) times a week. At least 48 hours apart.  Brand name only. 02/20/20   Pieter Partridge, DO  hydrocortisone 2.5 % ointment Apply topically 2 (two) times daily. 09/26/20   [provider]  KLOR-CON M20 20 MEQ tablet TAKE 1 TABLET BY MOUTH EVERY DAY. MUST KEEP UPCOMING APPT FOR REFILLS Patient not taking: Reported on 08/09/2019 11/06/17   Sueanne Margarita, MD  lansoprazole (PREVACID) 30 MG capsule Take 30 mg by mouth daily at 12 noon.    [provider]  lisinopril-hydrochlorothiazide (PRINZIDE,ZESTORETIC) 10-12.5 MG per tablet Take 1 tablet by mouth daily.    [provider]  loperamide (IMODIUM) 2 MG capsule Take by mouth.    [provider]  memantine (NAMENDA) 10 MG tablet Take by mouth. 12/26/21   [provider]  NONFORMULARY OR COMPOUNDED ITEM Antifungal solution: Terbinafine 3%, Fluconazole 2%, Tea Tree Oil 5%, Urea 10%, Ibuprofen 2% in DMSO suspension #43m 06/03/19   GMarzetta Board DPM  NONFORMULARY OR COMPOUNDED ITEM [facility]:  Antifungal topical - Terbinafine 3%, Fluconazole 2%, Tea Tree Oil 5%, Urea 10%, 2% Ibuprofen in #[id number] Suspension. Apply to affected toenail(s) once daily (at bedtime) or twice daily. 07/12/19   GaMarzetta BoardDPM  omeprazole (PRILOSEC) 20 MG capsule Take by mouth.    [provider]  ondansetron (ZOFRAN) 4 MG tablet Take 1  tablet (4 mg total) by mouth every 8 (eight) hours as needed for up to 15 doses for nausea or vomiting. 02/12/20   Curatolo, Adam, DO  oxyCODONE (ROXICODONE) 5 MG immediate release tablet Take 1 tablet (5 mg total) by mouth every 6 (six) hours as needed for up to 15 doses. 02/12/20   Curatolo, Adam, DO  pravastatin (PRAVACHOL) 40 MG tablet Take 40 mg by mouth daily.    [provider]  pyridOXINE (VITAMIN B-6) 100 MG tablet Take 100 mg by mouth daily.     [provider]  tamsulosin (FLOMAX) 0.4 MG CAPS capsule Take 0.4 mg by mouth daily.    [provider]  triamcinolone cream (KENALOG) 0.1 %  09/22/17   [provider]  vitamin B-12 (CYANOCOBALAMIN) 1000 MCG tablet Take 1,000 mcg by mouth every other day. Three times a week    [provider]  Vitamin E 180 MG (400 UNIT) CAPS 1 capsule    [provider]  vitamin E 400 UNIT capsule Take 400 Units by mouth daily.    [provider]  zoledronic acid (RECLAST) 5 MG/100ML SOLN injection Inject 5 mg into the vein once.    [provider]      Allergies    Shellfish allergy, Shellfish-derived products, Cetirizine, Fluzone quadrivalent [influenza vac split quad], Fluzone [influenza virus vaccine], Guaifenesin, Haemophilus b polysaccharide vaccine, Nitrofuran derivatives, and Sulfa antibiotics    Review of Systems   Review of Systems  Gastrointestinal:  Positive for abdominal pain.  Genitourinary:  Positive for flank pain.  All other systems reviewed and are negative.   Physical Exam Updated Vital Signs BP (!) 159/66   Pulse 65   Temp 98.6 F (37 C) (Oral)   Resp 17   Ht '5\' 7"'$  (1.702 m)   Wt 81.6 kg   SpO2 95%   BMI 28.18 kg/m  Physical Exam Vitals and nursing note reviewed.  Constitutional:      General: She is not in acute distress.    Appearance: She is well-developed.     Comments: Pleasantly demented, GCS 14  HENT:     Head: Normocephalic and atraumatic.  Eyes:     Conjunctiva/sclera: Conjunctivae normal.  Cardiovascular:     Rate and Rhythm: Normal rate and regular rhythm.     Heart sounds: No murmur heard. Pulmonary:     Effort: Pulmonary effort is normal. No respiratory distress.     Breath sounds: Normal breath sounds.  Abdominal:     Palpations: Abdomen is soft.     Tenderness: There is abdominal tenderness in the left lower quadrant. There is no right CVA tenderness, left CVA tenderness, guarding or  rebound.  Musculoskeletal:        General: No swelling.     Cervical back: Neck supple.  Skin:    General: Skin is warm and dry.     Capillary Refill: Capillary refill takes less than 2 seconds.  Neurological:     Mental Status: She is alert.  Psychiatric:        Mood and Affect: Mood normal.     ED Results / Procedures / Treatments   Labs (all labs ordered are listed, but only abnormal results are displayed) Labs Reviewed  COMPREHENSIVE METABOLIC PANEL - Abnormal; Notable for the following components:      Result Value   Glucose, Bld 118 (*)    BUN 32 (*)    Creatinine, Ser 1.72 (*)    Calcium 10.7 (*)  GFR, Estimated 31 (*)    All other components within normal limits  CBC - Abnormal; Notable for the following components:   WBC 11.6 (*)    All other components within normal limits  URINALYSIS, ROUTINE W REFLEX MICROSCOPIC - Abnormal; Notable for the following components:   Hgb urine dipstick SMALL (*)    Protein, ur TRACE (*)    Leukocytes,Ua TRACE (*)    Bacteria, UA RARE (*)    All other components within normal limits  LIPASE, BLOOD    EKG None  Radiology CT Renal Stone Study  Result Date: 03/26/2022 CLINICAL DATA:  Left-sided flank pain beginning yesterday. EXAM: CT ABDOMEN AND PELVIS WITHOUT CONTRAST TECHNIQUE: Multidetector CT imaging of the abdomen and pelvis was performed following the standard protocol without IV contrast. RADIATION DOSE REDUCTION: This exam was performed according to the departmental dose-optimization program which includes automated exposure control, adjustment of the mA and/or kV according to patient size and/or use of iterative reconstruction technique. COMPARISON:  02/12/2020. FINDINGS: Lower chest: No acute findings. Small ill-defined nodule, right lower lobe, image 2, series 4, unchanged from the prior CT, benign. No follow-up recommended. Hepatobiliary: No focal liver abnormality is seen. Status post cholecystectomy. No biliary  dilatation. Pancreas: Unremarkable. No pancreatic ductal dilatation or surrounding inflammatory changes. Spleen: Normal in size without focal abnormality. Adrenals/Urinary Tract: No adrenal mass. Moderate to marked left hydronephrosis. Moderate left hydroureter. This is due to a 3 mm stone at the ureterovesicular junction. 1-2 mm stone projects above this along the anterior margin of the left distal ureter, which was present previously and appears to be an adjacent vascular calcification. No other ureteral stones. No right hydronephrosis. No renal masses. Small nonobstructing stones in the lower pole the right kidney. Small nonobstructing stones in the upper and lower pole of the left kidney. Normal right ureter. Bladder decompressed, otherwise unremarkable. Stomach/Bowel: Small hiatal hernia. Stomach otherwise unremarkable. Small bowel and colon are normal in caliber. No wall thickening. No inflammation. Normal appendix. Vascular/Lymphatic: Mild aortic atherosclerosis. No aneurysm. No enlarged lymph nodes. Reproductive: Status post hysterectomy. No adnexal masses. Other: No abdominal wall hernia or abnormality. No abdominopelvic ascites. Musculoskeletal: No fracture or acute finding.  No bone lesion. IMPRESSION: 1. 3 mm stone at the left ureterovesicular junction causes moderate to marked left hydronephrosis and moderate left hydroureter. 2. No other acute abnormality within the abdomen or pelvis. 3. Small bilateral nonobstructing intrarenal stones. Electronically Signed   By: Lajean Manes M.D.   On: 03/26/2022 16:22    Procedures Procedures    Medications Ordered in ED Medications  lactated ringers bolus 1,000 mL (0 mLs Intravenous Stopped 03/26/22 1746)    ED Course/ Medical Decision Making/ A&P Clinical Course as of 03/26/22 1801  Wed Mar 26, 2022  1608 Protein(!): TRACE [JL]  1608 Leukocytes,Ua(!): TRACE [JL]  1608 Bacteria, UA(!): RARE [JL]  1608 Hgb urine dipstick(!): SMALL [JL]  1608  Creatinine(!): 1.72 [JL]    Clinical Course User Index [JL] Regan Lemming, MD                           Medical Decision Making Amount and/or Complexity of Data Reviewed Labs: ordered. Decision-making details documented in ED Course. Radiology: ordered.  Risk Prescription drug management.     76 year old female with medical history significant for Alzheimer's dementia, HTN, HLD, CAD, nephrolithiasis who presents to the emergency department with left-sided flank pain, left lower quadrant abdominal pain, increased foul-smelling urine.  The patient has dementia at baseline.  She had some nausea and an episode of emesis yesterday that was NBNB.  No known fevers.  She just recently completed a course of antibiotics for urinary tract infection.  She does have a history of kidney stones and her symptoms seem to be similar to a prior episode.  She is difficult on arrival to get a history from as she has dementia at baseline.  She presents with family members bedside who provide additional history.  On arrival, the patient was vitally stable, afebrile, not tachycardic, hypertensive BP 160/81, saturating well on room air.  Sinus rhythm noted on cardiac telemetry.  Physical exam significant for mild left lower quadrant tenderness to palpation.  Differential diagnosis includes urinary tract infection, nephrolithiasis/ureterolithiasis, pyelonephritis, diverticulitis, less likely small bowel obstruction.  Her evaluation significant for CMP with evidence of an AKI with a creatinine of 1.72 from a baseline per daughter of around 1.4, urinalysis with trace leukocytes and rare bacteria, hemoglobin present.  CBC with mild leukocytosis to 11.6.  CT renal stone study as follows: IMPRESSION:  1. 3 mm stone at the left ureterovesicular junction causes moderate  to marked left hydronephrosis and moderate left hydroureter.  2. No other acute abnormality within the abdomen or pelvis.  3. Small bilateral  nonobstructing intrarenal stones.   I spoke with Dr. Gloriann Loan of on-call urology who recommended outpatient follow-up, return precautions provided.  Patient is tolerating oral intake.  She received an IV fluid bolus and a single dose of IV Toradol.  Her pain appears to be well controlled and she is tolerating p.o.  Advised continued management with oral hydration at home, Tylenol for pain control, family declined opiates.  We will treat with a course of antibiotics given bacteriuria in the setting of nephrolithiasis. Not septic, at baseline without pain, tolerating PO at time of discharge.  Final Clinical Impression(s) / ED Diagnoses Final diagnoses:  Nephrolithiasis    Rx / DC Orders ED Discharge Orders          Ordered    cefpodoxime (VANTIN) 200 MG tablet  2 times daily        03/26/22 1731    Ambulatory referral to Urology        03/26/22 1734              Regan Lemming, MD 03/26/22 1735    Regan Lemming, MD 03/26/22 1801

## 2022-03-26 NOTE — ED Triage Notes (Signed)
Patient here POV from Home with family.  Endorses Left Sided Flank Pain since Yesterday.   No Known Dysuria. Some Nausea Yesterday. No Emesis. No Known Fevers.   NAD Noted during Triage. A&Ox4. GCS 15. Ambulatory with [facility].

## 2022-03-28 DIAGNOSIS — N183 Chronic kidney disease, stage 3 unspecified: Secondary | ICD-10-CM | POA: Diagnosis not present

## 2022-03-28 DIAGNOSIS — I10 Essential (primary) hypertension: Secondary | ICD-10-CM | POA: Diagnosis not present

## 2022-03-28 DIAGNOSIS — I700 Atherosclerosis of aorta: Secondary | ICD-10-CM | POA: Diagnosis not present

## 2022-03-28 DIAGNOSIS — N200 Calculus of kidney: Secondary | ICD-10-CM | POA: Diagnosis not present

## 2022-03-29 ENCOUNTER — Other Ambulatory Visit: Payer: Self-pay

## 2022-03-29 ENCOUNTER — Encounter (HOSPITAL_BASED_OUTPATIENT_CLINIC_OR_DEPARTMENT_OTHER): Payer: Self-pay

## 2022-03-29 ENCOUNTER — Emergency Department (HOSPITAL_BASED_OUTPATIENT_CLINIC_OR_DEPARTMENT_OTHER)
Admission: EM | Admit: 2022-03-29 | Discharge: 2022-03-29 | Disposition: A | Discharge status: Home / Self Care | Payer: Medicare Other | Attending: Emergency Medicine | Admitting: Emergency Medicine

## 2022-03-29 DIAGNOSIS — R4182 Altered mental status, unspecified: Secondary | ICD-10-CM | POA: Diagnosis not present

## 2022-03-29 DIAGNOSIS — F0390 Unspecified dementia without behavioral disturbance: Secondary | ICD-10-CM | POA: Insufficient documentation

## 2022-03-29 DIAGNOSIS — R109 Unspecified abdominal pain: Secondary | ICD-10-CM | POA: Insufficient documentation

## 2022-03-29 DIAGNOSIS — R5383 Other fatigue: Secondary | ICD-10-CM | POA: Diagnosis present

## 2022-03-29 LAB — CBC WITH DIFFERENTIAL/PLATELET
Abs Immature Granulocytes: 0.05 10*3/uL (ref 0.00–0.07)
Basophils Absolute: 0 10*3/uL (ref 0.0–0.1)
Basophils Relative: 0 %
Eosinophils Absolute: 0 10*3/uL (ref 0.0–0.5)
Eosinophils Relative: 0 %
HCT: 33.5 % — ABNORMAL LOW (ref 36.0–46.0)
Hemoglobin: 11.2 g/dL — ABNORMAL LOW (ref 12.0–15.0)
Immature Granulocytes: 0 %
Lymphocytes Relative: 11 %
Lymphs Abs: 1.3 10*3/uL (ref 0.7–4.0)
MCH: 29 pg (ref 26.0–34.0)
MCHC: 33.4 g/dL (ref 30.0–36.0)
MCV: 86.8 fL (ref 80.0–100.0)
Monocytes Absolute: 1.3 10*3/uL — ABNORMAL HIGH (ref 0.1–1.0)
Monocytes Relative: 11 %
Neutro Abs: 9.4 10*3/uL — ABNORMAL HIGH (ref 1.7–7.7)
Neutrophils Relative %: 78 %
Platelets: 192 10*3/uL (ref 150–400)
RBC: 3.86 MIL/uL — ABNORMAL LOW (ref 3.87–5.11)
RDW: 12.6 % (ref 11.5–15.5)
WBC: 12 10*3/uL — ABNORMAL HIGH (ref 4.0–10.5)
nRBC: 0 % (ref 0.0–0.2)

## 2022-03-29 LAB — COMPREHENSIVE METABOLIC PANEL
ALT: 9 U/L (ref 0–44)
AST: 13 U/L — ABNORMAL LOW (ref 15–41)
Albumin: 3.9 g/dL (ref 3.5–5.0)
Alkaline Phosphatase: 46 U/L (ref 38–126)
Anion gap: 10 (ref 5–15)
BUN: 31 mg/dL — ABNORMAL HIGH (ref 8–23)
CO2: 27 mmol/L (ref 22–32)
Calcium: 9.5 mg/dL (ref 8.9–10.3)
Chloride: 103 mmol/L (ref 98–111)
Creatinine, Ser: 2 mg/dL — ABNORMAL HIGH (ref 0.44–1.00)
GFR, Estimated: 26 mL/min — ABNORMAL LOW (ref >60)
Glucose, Bld: 108 mg/dL — ABNORMAL HIGH (ref 70–99)
Potassium: 3.4 mmol/L — ABNORMAL LOW (ref 3.5–5.1)
Sodium: 140 mmol/L (ref 135–145)
Total Bilirubin: 0.8 mg/dL (ref 0.3–1.2)
Total Protein: 6.6 g/dL (ref 6.5–8.1)

## 2022-03-29 LAB — LIPASE, BLOOD: Lipase: 11 U/L (ref 11–51)

## 2022-03-29 LAB — TROPONIN I (HIGH SENSITIVITY): Troponin I (High Sensitivity): 35 ng/L — ABNORMAL HIGH (ref <18)

## 2022-03-29 MED ORDER — LACTATED RINGERS IV BOLUS
1000.0000 mL | Freq: Once | INTRAVENOUS | Status: AC
  Administered 2022-03-29: 1000 mL via INTRAVENOUS

## 2022-03-29 NOTE — ED Notes (Signed)
Pt unable to void, assisted to bsc with no results.

## 2022-03-29 NOTE — ED Triage Notes (Signed)
Pt returns today with son.   Pt was recnetly dx with kidney stones. Pt followed up with PCP yesterday and gave blood work, UA.   Today, pt has been more lethargic than normal. Pt was c/o left sided flank pain this morning and was given 1 ibuprofen. Pt denies any pain at this time. Per son, she has been asleep on and off all day. Son states that she was clammy earlier.  Pt has been taken abx as prescribed, other than she has NOT had this morning's dose.

## 2022-03-29 NOTE — Discharge Instructions (Signed)
Ms. Kristina Donovan was seen today for altered mental status.  We are uncertain what caused this episode, it may be related to her ongoing worsening kidney function in the setting of a kidney stone.  She did have hydronephrosis appreciated though this was communicated during her last visit with urology who recommended outpatient care and management.  We have discussed repeat involvement of urology today versus keeping her current outpatient care plan.  I would recommend aggressive hydration, we have attempted to rehydrate IV today and calling urology to move her appointment up if possible.  Otherwise there is no acute indication for intervention at this time.  Please return if she has any further altered mental status episodes, has decreased urine output over 24 hours.

## 2022-03-29 NOTE — ED Provider Notes (Signed)
[facility] EMERGENCY DEPT Provider Note   CSN: [id number] Arrival date & time: 03/29/22  1251     History Chief Complaint  Patient presents with   Fatigue    HPI Rether Rison is a 76 y.o. female presenting for altered mental status today.  Patient is a 76 year old female history of dementia who lives with her brother.  Sibling states that she has had altered mental status this morning.  She was diagnosed with nephrolithiasis 3 days ago only 3 mm though causing obstruction.  They deny fevers or chills nausea vomiting syncope shortness of breath.  It was just difficult to arouse patient this morning.  She is now returned to her normal mental status.  Patient denies any concerns at this time.  Patient's recorded medical, surgical, social, medication list and allergies were reviewed in the Snapshot window as part of the initial history.   Review of Systems   Review of Systems  Constitutional:  Negative for chills and fever.  HENT:  Negative for ear pain and sore throat.   Eyes:  Negative for pain and visual disturbance.  Respiratory:  Negative for cough and shortness of breath.   Cardiovascular:  Negative for chest pain and palpitations.  Gastrointestinal:  Negative for abdominal pain and vomiting.  Genitourinary:  Negative for dysuria and hematuria.  Musculoskeletal:  Negative for arthralgias and back pain.  Skin:  Negative for color change and rash.  Neurological:  Negative for seizures and syncope.  All other systems reviewed and are negative.   Physical Exam Updated Vital Signs BP (!) 171/68   Pulse 63   Temp 98.8 F (37.1 C) (Oral)   Resp 16   Ht '5\' 7"'$  (1.702 m)   Wt 81.2 kg   SpO2 98%   BMI 28.04 kg/m  Physical Exam Vitals and nursing note reviewed.  Constitutional:      General: She is not in acute distress.    Appearance: She is well-developed.  HENT:     Head: Normocephalic and atraumatic.  Eyes:     Conjunctiva/sclera: Conjunctivae  normal.  Cardiovascular:     Rate and Rhythm: Normal rate and regular rhythm.     Heart sounds: No murmur heard. Pulmonary:     Effort: Pulmonary effort is normal. No respiratory distress.     Breath sounds: Normal breath sounds.  Abdominal:     Palpations: Abdomen is soft.     Tenderness: There is abdominal tenderness.  Musculoskeletal:        General: No swelling.     Cervical back: Neck supple.  Skin:    General: Skin is warm and dry.     Capillary Refill: Capillary refill takes less than 2 seconds.  Neurological:     Mental Status: She is alert.  Psychiatric:        Mood and Affect: Mood normal.      ED Course/ Medical Decision Making/ A&P    Procedures Procedures   Medications Ordered in ED Medications  lactated ringers bolus 1,000 mL (1,000 mLs Intravenous New Bag/Given 03/29/22 1346)    Medical Decision Making:    Mahlet Jergens is a 76 y.o. female who presented to the ED today with altered mental status detailed above.     Patient's presentation is complicated by their history of multiple comorbid medical problems including advanced dementia, advanced age.  Patient placed on continuous vitals and telemetry monitoring while in ED which was reviewed periodically.   Complete initial physical exam performed, notably  the patient  was hemodynamically stable in no acute distress.      Reviewed and confirmed nursing documentation for past medical history, family history, social history.    Initial Assessment:   Patient's history of present this and initial exam findings are most consistent with ongoing nephrolithiasis.  Will evaluate for complicating urinary tract infection or nephropathy with screening labs including CBC, CMP with plan for reassessment no acute evidence of sepsis on initial screening vitals  Reassessment: On reassessment, patient has returned to near her baseline mental status, she is still less energetic than her baseline.  She has mild  abdominal pain but is otherwise tolerating p.o. intake in no acute distress.  Labs reveal no acute abnormality.  She does have slight worsening of her GFR from 31-26, slight rise in her creatinine.  However she has returned to her normal mental status and is overall well-appearing tolerating p.o. intake at this time.  Troponin ordered by triage in indeterminate range at this time.  Patient has been without chest pain or shortness of breath do not favor ACS at this time based on acute findings today.  Likely related to ongoing systemic strain from her nephrolithiasis.  No acute indication of infection on her white blood cell count or on urinalysis collected last night shown to be at bedside by family. Shared medical decision making with patient's family at bedside.  Offered repeat urology consultation and continued observation in the emergency department.  However given otherwise normal objective findings today, they would prefer discharge at this time and observation at home with plan to return if she has recurrence of her altered mental status episode. I favor this patient is likely stable for continued outpatient plan.  She has follow-up arranged for 48 hours with urologist for ultimate management and care planning.  Recommended strict returning to the emergency department if patient has any decompensation of their symptoms.  Or worsening of their clinical syndrome.  No acute indication for further intervention in the emergency department   Clinical Impression:  1. Altered mental status, unspecified altered mental status type      Data Unavailable   Final Clinical Impression(s) / ED Diagnoses Final diagnoses:  Altered mental status, unspecified altered mental status type    Rx / DC Orders ED Discharge Orders     None         Tretha Sciara, MD 03/29/22 1455

## 2022-03-29 NOTE — ED Notes (Signed)
Dc instructions reviewed with patient. Patient voiced understanding. Dc with belongings.  °

## 2022-04-01 DIAGNOSIS — N201 Calculus of ureter: Secondary | ICD-10-CM | POA: Diagnosis not present

## 2022-04-01 DIAGNOSIS — N3281 Overactive bladder: Secondary | ICD-10-CM | POA: Diagnosis not present

## 2022-04-01 DIAGNOSIS — R350 Frequency of micturition: Secondary | ICD-10-CM | POA: Diagnosis not present

## 2022-04-07 ENCOUNTER — Ambulatory Visit (INDEPENDENT_AMBULATORY_CARE_PROVIDER_SITE_OTHER): Payer: Medicare Other | Admitting: Podiatry

## 2022-04-07 ENCOUNTER — Encounter: Payer: Self-pay | Admitting: Podiatry

## 2022-04-07 DIAGNOSIS — L600 Ingrowing nail: Secondary | ICD-10-CM | POA: Diagnosis not present

## 2022-04-07 DIAGNOSIS — M79676 Pain in unspecified toe(s): Secondary | ICD-10-CM | POA: Diagnosis not present

## 2022-04-07 DIAGNOSIS — B351 Tinea unguium: Secondary | ICD-10-CM | POA: Diagnosis not present

## 2022-04-07 DIAGNOSIS — L603 Nail dystrophy: Secondary | ICD-10-CM | POA: Diagnosis not present

## 2022-04-07 NOTE — Progress Notes (Signed)
Subjective:  Patient ID: Kristina Donovan, female    DOB: [birth year],   MRN: [id number]  No chief complaint on file.   76 y.o. female presents for concern of right great and second toenail thickness and pain. Relates the right digits 12,4 and 5 are thickened and resistant to fungal treatment. Here today with daughter. Relates she picks at them and complains and requesting to have removed. Regularly sees Dr. Elisha Ponder for nail care.  Denies any other pedal complaints. Denies n/v/f/c.   Past Medical History:  Diagnosis Date   B12 deficiency    w chronic gastritis   Carotid artery stenosis 2008   1-39% stenosis by dopplers 11/3297   Diastolic dysfunction    no history of CHF   Elbow fracture    Fecal incontinence    Hx of this   Fracture, ankle    right   Fracture, foot    left   Hyperlipidemia    Hypertension    IBS (irritable bowel syndrome)    LBBB (left bundle branch block) 01/11/2014   Lung nodule    right lower lung   Mild mitral regurgitation    Multiple sclerosis, relapsing-remitting (HCC)    Perennial allergic rhinitis    Urinary, incontinence, stress female     Objective:  Physical Exam: Vascular: DP/PT pulses 2/4 bilateral. CFT <3 seconds. Normal hair growth on digits. No edema.  Skin. No lacerations or abrasions bilateral feet. Right hallux second and fourth and fifth digits are thickened elongated and with subungual debris. Tender to touch.  Musculoskeletal: MMT 5/5 bilateral lower extremities in DF, PF, Inversion and Eversion. Deceased ROM in DF of ankle joint.  Neurological: Sensation intact to light touch.   Assessment:   1. Pain due to onychomycosis of toenail   2. Ingrown right greater toenail   3. Ingrown nail of second toe of right foot      Plan:  Patient was evaluated and treated and all questions answered. Patient requesting removal of ingrown nail today. Procedure below.  Discussed procedure and post procedure care and patient expressed  understanding.  Will follow-up in 2 weeks for nail check or sooner if any problems arise.    Procedure:  Procedure: total Nail Avulsion of right hallux, right second toenail and right fourth and fifth digits.   Surgeon: Lorenda Peck, DPM  Pre-op Dx: Ingrown toenail without infection Post-op: Same  Place of Surgery: Office exam room.  Indications for surgery: Painful and ingrown toenail.    The patient is requesting removal of nail with chemical matrixectomy. Risks and complications were discussed with the patient for which they understand and written consent was obtained. Under sterile conditions a total of 3 mL of  1% lidocaine plain was infiltrated in a hallux block fashion. Once anesthetized, the skin was prepped in sterile fashion. A tourniquet was then applied. Next entire nail of right hallux, second fourth and fifth digits removed.   Next phenol was then applied under standard conditions and copiously irrigated. Silvadene was applied. A dry sterile dressing was applied. After application of the dressing the tourniquet was removed and there is found to be an immediate capillary refill time to the digit. The patient tolerated the procedure well without any complications. Post procedure instructions were discussed the patient for which he verbally understood. Follow-up in two weeks for nail check or sooner if any problems are to arise. Discussed signs/symptoms of infection and directed to call the office immediately should any occur or go directly to  the emergency room. In the meantime, encouraged to call the office with any questions, concerns, changes symptoms.   Lorenda Peck, DPM

## 2022-04-07 NOTE — Patient Instructions (Signed)

## 2022-04-11 ENCOUNTER — Ambulatory Visit: Payer: Medicare Other | Admitting: Podiatry

## 2022-04-25 ENCOUNTER — Ambulatory Visit (INDEPENDENT_AMBULATORY_CARE_PROVIDER_SITE_OTHER): Payer: Medicare Other | Admitting: Podiatry

## 2022-04-25 DIAGNOSIS — B351 Tinea unguium: Secondary | ICD-10-CM | POA: Diagnosis not present

## 2022-04-25 DIAGNOSIS — M79676 Pain in unspecified toe(s): Secondary | ICD-10-CM

## 2022-04-25 DIAGNOSIS — Z9889 Other specified postprocedural states: Secondary | ICD-10-CM | POA: Diagnosis not present

## 2022-04-25 NOTE — Patient Instructions (Addendum)
Soak right foot once daily for an additional week.  After one week of soaks, apply Neosporin to great toe, 2nd toe and 4th toe nailbeds once daily and leave open to air.   Please call office or contact us through [facility] with any questions or concerns.

## 2022-04-30 ENCOUNTER — Encounter: Payer: Self-pay | Admitting: Podiatry

## 2022-04-30 NOTE — Progress Notes (Signed)
  Subjective:  Patient ID: Kristina Donovan, female    DOB: Sep 26, 1945,  MRN: [id number]  Kristina Donovan presents to clinic today for: s/p matrixectomy toenails 1-5 right foot. Has been soaking twice daily per son. Chief Complaint  Patient presents with   Nail Problem    Routine foot care PCP-Hammer, Eli PCP [facility] 3 WEEKS AGO    Patient presents to clinic accompanied by her son on today's visit. I was able to speak to her daughter via speakerphone during the visit. Dr. Blenda Mounts did remove all toenails on the right foot.  PCP is Collene Leyden, MD , and last visit was September, 2023.  Allergies  Allergen Reactions   Shellfish Allergy Anaphylaxis   Shellfish-Derived Products Anaphylaxis   Cetirizine Swelling   Fluzone Quadrivalent [Influenza Vac Split Quad]     Other reaction(s): increased MS sx   Fluzone [Influenza Virus Vaccine]     Increased MS   Guaifenesin     Other reaction(s): Other (See Comments)   Haemophilus B Polysaccharide Vaccine     Other reaction(s): Other (See Comments) Increased MS   Nitrofuran Derivatives Diarrhea   Sulfa Antibiotics Hives   Sulfamethoxazole-Trimethoprim     Other reaction(s): Unknown    Review of Systems: Negative except as noted in the HPI.  Objective: No changes noted in today's physical examination.  Kristina Donovan is a pleasant 76 y.o. female WD, WN in NAD. AAO x 3. Vascular:  Capillary refill time to digits immediate b/l. Palpable pedal pulses b/l LE. Pedal hair present. Lower extremity skin temperature gradient within normal limits. No pain with calf compression b/l.  Dermatological:  Pedal skin with normal turgor, texture and tone bilaterally. No open wounds bilaterally. No interdigital macerations bilaterally.  Right 5th digit noted to be completely healed.  She does have dried serosanguinous drainage of right great, right 2nd and right 4th digits. Post removal of scab revealed nailbed with granular  nailbeds. She has proximal nailfold erythema from noted congestion. Left hallux nail 80% cleared of fungal debris.  Musculoskeletal:  Normal muscle strength 5/5 to all lower extremity muscle groups bilaterally. No pain crepitus or joint limitation noted with ROM b/l. No gross bony deformities bilaterally.  Neurological:  Protective sensation intact 5/5 intact bilaterally with 10g monofilament b/l. Vibratory sensation intact b/l. Clonus negative b/l. Assessment/Plan: 1. Status post nail surgery     No orders of the defined types were placed in this encounter.   -Patient with h/o dementia/Alzheimer's/cognitive deficit. Patient's family member present. All questions/concerns addressed on today's visit. -Consent given for treatment as described below: -Examined patient. -Nailbeds debrided of dried serosanguinous drainage. Nailbeds cleansed with alcohol. TAO and band-aid applied. -Family instructed to soak right foot once daily for an additional week. After one week of soaks, apply Neosporin to right great toe, right 2nd toe and right 4th toe once daily and leave open to air. Call office if there are any concerns. Instructions given on AVS. -Patient/POA to call should there be question/concern in the interim.   Return if symptoms worsen or fail to improve.  Marzetta Board, DPM

## 2022-05-12 ENCOUNTER — Encounter: Payer: Self-pay | Admitting: Podiatry

## 2022-06-03 DIAGNOSIS — G301 Alzheimer's disease with late onset: Secondary | ICD-10-CM | POA: Diagnosis not present

## 2022-06-03 DIAGNOSIS — Z23 Encounter for immunization: Secondary | ICD-10-CM | POA: Diagnosis not present

## 2022-06-03 DIAGNOSIS — F02B4 Dementia in other diseases classified elsewhere, moderate, with anxiety: Secondary | ICD-10-CM | POA: Diagnosis not present

## 2022-06-03 DIAGNOSIS — F02A4 Dementia in other diseases classified elsewhere, mild, with anxiety: Secondary | ICD-10-CM | POA: Diagnosis not present

## 2022-06-03 DIAGNOSIS — Z79899 Other long term (current) drug therapy: Secondary | ICD-10-CM | POA: Diagnosis not present

## 2022-06-25 ENCOUNTER — Other Ambulatory Visit: Payer: Self-pay | Admitting: Family Medicine

## 2022-06-25 DIAGNOSIS — R1310 Dysphagia, unspecified: Secondary | ICD-10-CM | POA: Diagnosis not present

## 2022-07-04 ENCOUNTER — Other Ambulatory Visit: Payer: Self-pay | Admitting: Family Medicine

## 2022-07-04 ENCOUNTER — Ambulatory Visit
Admission: RE | Admit: 2022-07-04 | Discharge: 2022-07-04 | Disposition: A | Discharge status: Home / Self Care | Payer: Medicare Other | Source: Ambulatory Visit | Attending: Family Medicine | Admitting: Family Medicine

## 2022-07-04 DIAGNOSIS — R1310 Dysphagia, unspecified: Secondary | ICD-10-CM

## 2022-07-09 DIAGNOSIS — R399 Unspecified symptoms and signs involving the genitourinary system: Secondary | ICD-10-CM | POA: Diagnosis not present

## 2022-07-18 DIAGNOSIS — G301 Alzheimer's disease with late onset: Secondary | ICD-10-CM | POA: Diagnosis not present

## 2022-07-18 DIAGNOSIS — F02B4 Dementia in other diseases classified elsewhere, moderate, with anxiety: Secondary | ICD-10-CM | POA: Diagnosis not present

## 2022-07-31 DIAGNOSIS — I447 Left bundle-branch block, unspecified: Secondary | ICD-10-CM | POA: Diagnosis not present

## 2022-07-31 DIAGNOSIS — I051 Rheumatic mitral insufficiency: Secondary | ICD-10-CM | POA: Diagnosis not present

## 2022-07-31 DIAGNOSIS — Z7983 Long term (current) use of bisphosphonates: Secondary | ICD-10-CM | POA: Diagnosis not present

## 2022-07-31 DIAGNOSIS — J3089 Other allergic rhinitis: Secondary | ICD-10-CM | POA: Diagnosis not present

## 2022-07-31 DIAGNOSIS — I1310 Hypertensive heart and chronic kidney disease without heart failure, with stage 1 through stage 4 chronic kidney disease, or unspecified chronic kidney disease: Secondary | ICD-10-CM | POA: Diagnosis not present

## 2022-07-31 DIAGNOSIS — K589 Irritable bowel syndrome without diarrhea: Secondary | ICD-10-CM | POA: Diagnosis not present

## 2022-07-31 DIAGNOSIS — I700 Atherosclerosis of aorta: Secondary | ICD-10-CM | POA: Diagnosis not present

## 2022-07-31 DIAGNOSIS — I6523 Occlusion and stenosis of bilateral carotid arteries: Secondary | ICD-10-CM | POA: Diagnosis not present

## 2022-07-31 DIAGNOSIS — R911 Solitary pulmonary nodule: Secondary | ICD-10-CM | POA: Diagnosis not present

## 2022-07-31 DIAGNOSIS — N200 Calculus of kidney: Secondary | ICD-10-CM | POA: Diagnosis not present

## 2022-07-31 DIAGNOSIS — K219 Gastro-esophageal reflux disease without esophagitis: Secondary | ICD-10-CM | POA: Diagnosis not present

## 2022-07-31 DIAGNOSIS — D510 Vitamin B12 deficiency anemia due to intrinsic factor deficiency: Secondary | ICD-10-CM | POA: Diagnosis not present

## 2022-07-31 DIAGNOSIS — E7800 Pure hypercholesterolemia, unspecified: Secondary | ICD-10-CM | POA: Diagnosis not present

## 2022-07-31 DIAGNOSIS — G309 Alzheimer's disease, unspecified: Secondary | ICD-10-CM | POA: Diagnosis not present

## 2022-07-31 DIAGNOSIS — R1310 Dysphagia, unspecified: Secondary | ICD-10-CM | POA: Diagnosis not present

## 2022-07-31 DIAGNOSIS — G35 Multiple sclerosis: Secondary | ICD-10-CM | POA: Diagnosis not present

## 2022-07-31 DIAGNOSIS — Z87440 Personal history of urinary (tract) infections: Secondary | ICD-10-CM | POA: Diagnosis not present

## 2022-07-31 DIAGNOSIS — Z556 Problems related to health literacy: Secondary | ICD-10-CM | POA: Diagnosis not present

## 2022-07-31 DIAGNOSIS — F02A3 Dementia in other diseases classified elsewhere, mild, with mood disturbance: Secondary | ICD-10-CM | POA: Diagnosis not present

## 2022-07-31 DIAGNOSIS — M810 Age-related osteoporosis without current pathological fracture: Secondary | ICD-10-CM | POA: Diagnosis not present

## 2022-07-31 DIAGNOSIS — K2950 Unspecified chronic gastritis without bleeding: Secondary | ICD-10-CM | POA: Diagnosis not present

## 2022-07-31 DIAGNOSIS — Z9181 History of falling: Secondary | ICD-10-CM | POA: Diagnosis not present

## 2022-07-31 DIAGNOSIS — M1990 Unspecified osteoarthritis, unspecified site: Secondary | ICD-10-CM | POA: Diagnosis not present

## 2022-07-31 DIAGNOSIS — N183 Chronic kidney disease, stage 3 unspecified: Secondary | ICD-10-CM | POA: Diagnosis not present

## 2022-08-08 DIAGNOSIS — N183 Chronic kidney disease, stage 3 unspecified: Secondary | ICD-10-CM | POA: Diagnosis not present

## 2022-08-08 DIAGNOSIS — R1310 Dysphagia, unspecified: Secondary | ICD-10-CM | POA: Diagnosis not present

## 2022-08-08 DIAGNOSIS — G35 Multiple sclerosis: Secondary | ICD-10-CM | POA: Diagnosis not present

## 2022-08-08 DIAGNOSIS — F02A3 Dementia in other diseases classified elsewhere, mild, with mood disturbance: Secondary | ICD-10-CM | POA: Diagnosis not present

## 2022-08-08 DIAGNOSIS — G309 Alzheimer's disease, unspecified: Secondary | ICD-10-CM | POA: Diagnosis not present

## 2022-08-08 DIAGNOSIS — I1310 Hypertensive heart and chronic kidney disease without heart failure, with stage 1 through stage 4 chronic kidney disease, or unspecified chronic kidney disease: Secondary | ICD-10-CM | POA: Diagnosis not present

## 2022-08-15 DIAGNOSIS — G309 Alzheimer's disease, unspecified: Secondary | ICD-10-CM | POA: Diagnosis not present

## 2022-08-15 DIAGNOSIS — F02A3 Dementia in other diseases classified elsewhere, mild, with mood disturbance: Secondary | ICD-10-CM | POA: Diagnosis not present

## 2022-08-15 DIAGNOSIS — I1310 Hypertensive heart and chronic kidney disease without heart failure, with stage 1 through stage 4 chronic kidney disease, or unspecified chronic kidney disease: Secondary | ICD-10-CM | POA: Diagnosis not present

## 2022-08-15 DIAGNOSIS — R1310 Dysphagia, unspecified: Secondary | ICD-10-CM | POA: Diagnosis not present

## 2022-08-15 DIAGNOSIS — N183 Chronic kidney disease, stage 3 unspecified: Secondary | ICD-10-CM | POA: Diagnosis not present

## 2022-08-15 DIAGNOSIS — G35 Multiple sclerosis: Secondary | ICD-10-CM | POA: Diagnosis not present

## 2022-08-22 DIAGNOSIS — G35 Multiple sclerosis: Secondary | ICD-10-CM | POA: Diagnosis not present

## 2022-08-22 DIAGNOSIS — G309 Alzheimer's disease, unspecified: Secondary | ICD-10-CM | POA: Diagnosis not present

## 2022-08-22 DIAGNOSIS — R1310 Dysphagia, unspecified: Secondary | ICD-10-CM | POA: Diagnosis not present

## 2022-08-22 DIAGNOSIS — F02A3 Dementia in other diseases classified elsewhere, mild, with mood disturbance: Secondary | ICD-10-CM | POA: Diagnosis not present

## 2022-08-22 DIAGNOSIS — N183 Chronic kidney disease, stage 3 unspecified: Secondary | ICD-10-CM | POA: Diagnosis not present

## 2022-08-22 DIAGNOSIS — I1310 Hypertensive heart and chronic kidney disease without heart failure, with stage 1 through stage 4 chronic kidney disease, or unspecified chronic kidney disease: Secondary | ICD-10-CM | POA: Diagnosis not present

## 2022-08-26 DIAGNOSIS — E041 Nontoxic single thyroid nodule: Secondary | ICD-10-CM | POA: Diagnosis not present

## 2022-08-26 DIAGNOSIS — D510 Vitamin B12 deficiency anemia due to intrinsic factor deficiency: Secondary | ICD-10-CM | POA: Diagnosis not present

## 2022-08-26 DIAGNOSIS — M810 Age-related osteoporosis without current pathological fracture: Secondary | ICD-10-CM | POA: Diagnosis not present

## 2022-08-26 DIAGNOSIS — F329 Major depressive disorder, single episode, unspecified: Secondary | ICD-10-CM | POA: Diagnosis not present

## 2022-08-26 DIAGNOSIS — I519 Heart disease, unspecified: Secondary | ICD-10-CM | POA: Diagnosis not present

## 2022-08-26 DIAGNOSIS — E7800 Pure hypercholesterolemia, unspecified: Secondary | ICD-10-CM | POA: Diagnosis not present

## 2022-08-26 DIAGNOSIS — G35 Multiple sclerosis: Secondary | ICD-10-CM | POA: Diagnosis not present

## 2022-08-26 DIAGNOSIS — R350 Frequency of micturition: Secondary | ICD-10-CM | POA: Diagnosis not present

## 2022-08-26 DIAGNOSIS — G309 Alzheimer's disease, unspecified: Secondary | ICD-10-CM | POA: Diagnosis not present

## 2022-08-26 DIAGNOSIS — R1310 Dysphagia, unspecified: Secondary | ICD-10-CM | POA: Diagnosis not present

## 2022-08-26 DIAGNOSIS — R8290 Unspecified abnormal findings in urine: Secondary | ICD-10-CM | POA: Diagnosis not present

## 2022-08-26 DIAGNOSIS — I700 Atherosclerosis of aorta: Secondary | ICD-10-CM | POA: Diagnosis not present

## 2022-08-26 DIAGNOSIS — I10 Essential (primary) hypertension: Secondary | ICD-10-CM | POA: Diagnosis not present

## 2022-09-12 DIAGNOSIS — R350 Frequency of micturition: Secondary | ICD-10-CM | POA: Diagnosis not present

## 2022-09-12 DIAGNOSIS — R319 Hematuria, unspecified: Secondary | ICD-10-CM | POA: Diagnosis not present

## 2022-10-07 DIAGNOSIS — L821 Other seborrheic keratosis: Secondary | ICD-10-CM | POA: Diagnosis not present

## 2022-10-07 DIAGNOSIS — D225 Melanocytic nevi of trunk: Secondary | ICD-10-CM | POA: Diagnosis not present

## 2022-10-07 DIAGNOSIS — L814 Other melanin hyperpigmentation: Secondary | ICD-10-CM | POA: Diagnosis not present

## 2022-10-07 DIAGNOSIS — Z85828 Personal history of other malignant neoplasm of skin: Secondary | ICD-10-CM | POA: Diagnosis not present

## 2022-10-07 DIAGNOSIS — L570 Actinic keratosis: Secondary | ICD-10-CM | POA: Diagnosis not present

## 2022-10-07 DIAGNOSIS — L578 Other skin changes due to chronic exposure to nonionizing radiation: Secondary | ICD-10-CM | POA: Diagnosis not present

## 2022-10-24 DIAGNOSIS — N200 Calculus of kidney: Secondary | ICD-10-CM | POA: Diagnosis not present

## 2022-10-24 DIAGNOSIS — N319 Neuromuscular dysfunction of bladder, unspecified: Secondary | ICD-10-CM | POA: Diagnosis not present

## 2022-10-24 DIAGNOSIS — N3021 Other chronic cystitis with hematuria: Secondary | ICD-10-CM | POA: Diagnosis not present

## 2022-10-24 DIAGNOSIS — R8271 Bacteriuria: Secondary | ICD-10-CM | POA: Diagnosis not present

## 2022-10-28 DIAGNOSIS — G301 Alzheimer's disease with late onset: Secondary | ICD-10-CM | POA: Diagnosis not present

## 2023-02-06 DIAGNOSIS — N3021 Other chronic cystitis with hematuria: Secondary | ICD-10-CM | POA: Diagnosis not present

## 2023-02-06 DIAGNOSIS — N200 Calculus of kidney: Secondary | ICD-10-CM | POA: Diagnosis not present

## 2023-02-24 DIAGNOSIS — I10 Essential (primary) hypertension: Secondary | ICD-10-CM | POA: Diagnosis not present

## 2023-02-24 DIAGNOSIS — E7800 Pure hypercholesterolemia, unspecified: Secondary | ICD-10-CM | POA: Diagnosis not present

## 2023-02-24 DIAGNOSIS — Z0000 Encounter for general adult medical examination without abnormal findings: Secondary | ICD-10-CM | POA: Diagnosis not present

## 2023-02-24 DIAGNOSIS — D510 Vitamin B12 deficiency anemia due to intrinsic factor deficiency: Secondary | ICD-10-CM | POA: Diagnosis not present

## 2023-02-24 DIAGNOSIS — E038 Other specified hypothyroidism: Secondary | ICD-10-CM | POA: Diagnosis not present

## 2023-02-24 DIAGNOSIS — R413 Other amnesia: Secondary | ICD-10-CM | POA: Diagnosis not present

## 2023-02-24 DIAGNOSIS — R911 Solitary pulmonary nodule: Secondary | ICD-10-CM | POA: Diagnosis not present

## 2023-02-24 DIAGNOSIS — I519 Heart disease, unspecified: Secondary | ICD-10-CM | POA: Diagnosis not present

## 2023-02-24 DIAGNOSIS — G309 Alzheimer's disease, unspecified: Secondary | ICD-10-CM | POA: Diagnosis not present

## 2023-02-24 DIAGNOSIS — N3946 Mixed incontinence: Secondary | ICD-10-CM | POA: Diagnosis not present

## 2023-02-24 DIAGNOSIS — G35 Multiple sclerosis: Secondary | ICD-10-CM | POA: Diagnosis not present

## 2023-02-24 DIAGNOSIS — M8588 Other specified disorders of bone density and structure, other site: Secondary | ICD-10-CM | POA: Diagnosis not present

## 2023-06-11 DIAGNOSIS — N3021 Other chronic cystitis with hematuria: Secondary | ICD-10-CM | POA: Diagnosis not present

## 2023-06-30 DIAGNOSIS — G301 Alzheimer's disease with late onset: Secondary | ICD-10-CM | POA: Diagnosis not present

## 2023-07-06 DIAGNOSIS — G253 Myoclonus: Secondary | ICD-10-CM | POA: Diagnosis not present

## 2023-07-13 DIAGNOSIS — N3021 Other chronic cystitis with hematuria: Secondary | ICD-10-CM | POA: Diagnosis not present

## 2023-08-06 DIAGNOSIS — G9389 Other specified disorders of brain: Secondary | ICD-10-CM | POA: Diagnosis not present

## 2023-08-06 DIAGNOSIS — G253 Myoclonus: Secondary | ICD-10-CM | POA: Diagnosis not present

## 2023-08-06 DIAGNOSIS — R569 Unspecified convulsions: Secondary | ICD-10-CM | POA: Diagnosis not present

## 2023-08-06 DIAGNOSIS — R9401 Abnormal electroencephalogram [EEG]: Secondary | ICD-10-CM | POA: Diagnosis not present

## 2023-08-25 DIAGNOSIS — N319 Neuromuscular dysfunction of bladder, unspecified: Secondary | ICD-10-CM | POA: Diagnosis not present

## 2023-08-25 DIAGNOSIS — N3021 Other chronic cystitis with hematuria: Secondary | ICD-10-CM | POA: Diagnosis not present

## 2023-09-01 DIAGNOSIS — N183 Chronic kidney disease, stage 3 unspecified: Secondary | ICD-10-CM | POA: Diagnosis not present

## 2023-10-08 DIAGNOSIS — D485 Neoplasm of uncertain behavior of skin: Secondary | ICD-10-CM | POA: Diagnosis not present

## 2023-10-08 DIAGNOSIS — L820 Inflamed seborrheic keratosis: Secondary | ICD-10-CM | POA: Diagnosis not present

## 2023-10-08 DIAGNOSIS — D0462 Carcinoma in situ of skin of left upper limb, including shoulder: Secondary | ICD-10-CM | POA: Diagnosis not present

## 2023-10-08 DIAGNOSIS — C4441 Basal cell carcinoma of skin of scalp and neck: Secondary | ICD-10-CM | POA: Diagnosis not present

## 2023-10-20 DIAGNOSIS — C4441 Basal cell carcinoma of skin of scalp and neck: Secondary | ICD-10-CM | POA: Diagnosis not present

## 2023-10-27 DIAGNOSIS — G301 Alzheimer's disease with late onset: Secondary | ICD-10-CM | POA: Diagnosis not present

## 2023-11-18 DIAGNOSIS — F02B4 Dementia in other diseases classified elsewhere, moderate, with anxiety: Secondary | ICD-10-CM | POA: Diagnosis not present

## 2024-01-04 DIAGNOSIS — H1013 Acute atopic conjunctivitis, bilateral: Secondary | ICD-10-CM | POA: Diagnosis not present

## 2024-01-18 DIAGNOSIS — G301 Alzheimer's disease with late onset: Secondary | ICD-10-CM | POA: Diagnosis not present

## 2024-02-05 DIAGNOSIS — F02B4 Dementia in other diseases classified elsewhere, moderate, with anxiety: Secondary | ICD-10-CM | POA: Diagnosis not present

## 2024-02-05 DIAGNOSIS — G301 Alzheimer's disease with late onset: Secondary | ICD-10-CM | POA: Diagnosis not present

## 2024-02-25 DIAGNOSIS — E7800 Pure hypercholesterolemia, unspecified: Secondary | ICD-10-CM | POA: Diagnosis not present

## 2024-02-25 DIAGNOSIS — F329 Major depressive disorder, single episode, unspecified: Secondary | ICD-10-CM | POA: Diagnosis not present

## 2024-02-25 DIAGNOSIS — Z0000 Encounter for general adult medical examination without abnormal findings: Secondary | ICD-10-CM | POA: Diagnosis not present

## 2024-02-25 DIAGNOSIS — N200 Calculus of kidney: Secondary | ICD-10-CM | POA: Diagnosis not present

## 2024-02-25 DIAGNOSIS — R7301 Impaired fasting glucose: Secondary | ICD-10-CM | POA: Diagnosis not present

## 2024-02-25 DIAGNOSIS — D510 Vitamin B12 deficiency anemia due to intrinsic factor deficiency: Secondary | ICD-10-CM | POA: Diagnosis not present

## 2024-02-25 DIAGNOSIS — E038 Other specified hypothyroidism: Secondary | ICD-10-CM | POA: Diagnosis not present

## 2024-02-25 DIAGNOSIS — M810 Age-related osteoporosis without current pathological fracture: Secondary | ICD-10-CM | POA: Diagnosis not present

## 2024-02-25 DIAGNOSIS — I10 Essential (primary) hypertension: Secondary | ICD-10-CM | POA: Diagnosis not present

## 2024-02-25 DIAGNOSIS — N183 Chronic kidney disease, stage 3 unspecified: Secondary | ICD-10-CM | POA: Diagnosis not present

## 2024-02-25 DIAGNOSIS — R5381 Other malaise: Secondary | ICD-10-CM | POA: Diagnosis not present

## 2024-02-25 DIAGNOSIS — N390 Urinary tract infection, site not specified: Secondary | ICD-10-CM | POA: Diagnosis not present

## 2024-03-15 DIAGNOSIS — R8271 Bacteriuria: Secondary | ICD-10-CM | POA: Diagnosis not present

## 2024-03-15 DIAGNOSIS — N200 Calculus of kidney: Secondary | ICD-10-CM | POA: Diagnosis not present

## 2024-03-15 DIAGNOSIS — N319 Neuromuscular dysfunction of bladder, unspecified: Secondary | ICD-10-CM | POA: Diagnosis not present

## 2024-03-15 DIAGNOSIS — N3021 Other chronic cystitis with hematuria: Secondary | ICD-10-CM | POA: Diagnosis not present

## 2024-04-12 DIAGNOSIS — F03A0 Unspecified dementia, mild, without behavioral disturbance, psychotic disturbance, mood disturbance, and anxiety: Secondary | ICD-10-CM | POA: Diagnosis not present

## 2024-04-21 DIAGNOSIS — D485 Neoplasm of uncertain behavior of skin: Secondary | ICD-10-CM | POA: Diagnosis not present

## 2024-04-21 DIAGNOSIS — L570 Actinic keratosis: Secondary | ICD-10-CM | POA: Diagnosis not present

## 2024-04-21 DIAGNOSIS — D0462 Carcinoma in situ of skin of left upper limb, including shoulder: Secondary | ICD-10-CM | POA: Diagnosis not present

## 2024-05-04 DIAGNOSIS — F03A0 Unspecified dementia, mild, without behavioral disturbance, psychotic disturbance, mood disturbance, and anxiety: Secondary | ICD-10-CM | POA: Diagnosis not present

## 2024-06-01 DIAGNOSIS — G301 Alzheimer's disease with late onset: Secondary | ICD-10-CM | POA: Diagnosis not present

## 2024-06-01 DIAGNOSIS — F02B0 Dementia in other diseases classified elsewhere, moderate, without behavioral disturbance, psychotic disturbance, mood disturbance, and anxiety: Secondary | ICD-10-CM | POA: Diagnosis not present
